# Patient Record
Sex: Male | Born: 1980 | Race: White | Hispanic: No | Marital: Married | Smoking: Current some day smoker
Health system: Southern US, Community
[De-identification: ages and names within clinical notes are randomized; demographics above are authoritative.]

## PROBLEM LIST (undated history)

## (undated) DIAGNOSIS — D693 Immune thrombocytopenic purpura: Secondary | ICD-10-CM

## (undated) HISTORY — PX: TONSILLECTOMY: SUR1361

## (undated) HISTORY — PX: HAND SURGERY: SHX662

## (undated) HISTORY — DX: Immune thrombocytopenic purpura: D69.3

---
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
## 2002-06-27 ENCOUNTER — Ambulatory Visit (HOSPITAL_BASED_OUTPATIENT_CLINIC_OR_DEPARTMENT_OTHER): Admission: RE | Admit: 2002-06-27 | Discharge: 2002-06-27 | Payer: Self-pay | Admitting: Orthopedic Surgery

## 2003-11-27 ENCOUNTER — Emergency Department (HOSPITAL_COMMUNITY): Admission: EM | Admit: 2003-11-27 | Discharge: 2003-11-27 | Payer: Self-pay | Admitting: Emergency Medicine

## 2004-04-06 ENCOUNTER — Emergency Department (HOSPITAL_COMMUNITY): Admission: EM | Admit: 2004-04-06 | Discharge: 2004-04-06 | Payer: Self-pay | Admitting: Emergency Medicine

## 2015-04-19 ENCOUNTER — Emergency Department: Payer: Self-pay

## 2015-04-19 ENCOUNTER — Other Ambulatory Visit: Payer: Self-pay | Admitting: Family Medicine

## 2015-04-19 ENCOUNTER — Emergency Department
Admission: EM | Admit: 2015-04-19 | Discharge: 2015-04-19 | Disposition: A | Discharge status: Home / Self Care | Payer: Self-pay | Attending: Emergency Medicine | Admitting: Emergency Medicine

## 2015-04-19 ENCOUNTER — Encounter: Payer: Self-pay | Admitting: Emergency Medicine

## 2015-04-19 ENCOUNTER — Emergency Department
Admission: EM | Admit: 2015-04-19 | Discharge: 2015-04-20 | Disposition: A | Discharge status: Home / Self Care | Payer: Self-pay | Attending: Emergency Medicine | Admitting: Emergency Medicine

## 2015-04-19 DIAGNOSIS — D693 Immune thrombocytopenic purpura: Secondary | ICD-10-CM | POA: Insufficient documentation

## 2015-04-19 DIAGNOSIS — Z792 Long term (current) use of antibiotics: Secondary | ICD-10-CM | POA: Insufficient documentation

## 2015-04-19 DIAGNOSIS — Z720 Tobacco use: Secondary | ICD-10-CM | POA: Insufficient documentation

## 2015-04-19 DIAGNOSIS — N451 Epididymitis: Secondary | ICD-10-CM | POA: Insufficient documentation

## 2015-04-19 DIAGNOSIS — R1032 Left lower quadrant pain: Secondary | ICD-10-CM

## 2015-04-19 DIAGNOSIS — Z79899 Other long term (current) drug therapy: Secondary | ICD-10-CM | POA: Insufficient documentation

## 2015-04-19 DIAGNOSIS — R6883 Chills (without fever): Secondary | ICD-10-CM | POA: Insufficient documentation

## 2015-04-19 DIAGNOSIS — R590 Localized enlarged lymph nodes: Secondary | ICD-10-CM | POA: Insufficient documentation

## 2015-04-19 LAB — CBC WITH DIFFERENTIAL/PLATELET
BASOS ABS: 0 10*3/uL (ref 0–0.1)
EOS ABS: 0 10*3/uL (ref 0–0.7)
HCT: 42.5 % (ref 40.0–52.0)
Hemoglobin: 14.4 g/dL (ref 13.0–18.0)
Lymphs Abs: 1.8 10*3/uL (ref 1.0–3.6)
MCH: 35.5 pg — ABNORMAL HIGH (ref 26.0–34.0)
MCHC: 34 g/dL (ref 32.0–36.0)
MCV: 104.5 fL — ABNORMAL HIGH (ref 80.0–100.0)
Monocytes Absolute: 0.7 10*3/uL (ref 0.2–1.0)
Monocytes Relative: 10 %
Neutro Abs: 4.2 10*3/uL (ref 1.4–6.5)
Neutrophils Relative %: 63 %
PLATELETS: 41 10*3/uL — AB (ref 150–440)
RBC: 4.07 MIL/uL — AB (ref 4.40–5.90)
RDW: 12.4 % (ref 11.5–14.5)
WBC: 6.8 10*3/uL (ref 3.8–10.6)

## 2015-04-19 LAB — COMPREHENSIVE METABOLIC PANEL
ALBUMIN: 4.5 g/dL (ref 3.5–5.0)
ALT: 22 U/L (ref 17–63)
AST: 34 U/L (ref 15–41)
Alkaline Phosphatase: 68 U/L (ref 38–126)
Anion gap: 8 (ref 5–15)
BUN: 16 mg/dL (ref 6–20)
CHLORIDE: 100 mmol/L — AB (ref 101–111)
CO2: 27 mmol/L (ref 22–32)
CREATININE: 1.27 mg/dL — AB (ref 0.61–1.24)
Calcium: 9.2 mg/dL (ref 8.9–10.3)
GFR calc Af Amer: >60 mL/min (ref >60)
GFR calc non Af Amer: >60 mL/min (ref >60)
Glucose, Bld: 123 mg/dL — ABNORMAL HIGH (ref 65–99)
POTASSIUM: 3.7 mmol/L (ref 3.5–5.1)
SODIUM: 135 mmol/L (ref 135–145)
Total Bilirubin: 1.3 mg/dL — ABNORMAL HIGH (ref 0.3–1.2)
Total Protein: 7.4 g/dL (ref 6.5–8.1)

## 2015-04-19 LAB — URINALYSIS COMPLETE WITH MICROSCOPIC (ARMC ONLY)
BACTERIA UA: NONE SEEN
Bilirubin Urine: NEGATIVE
Glucose, UA: NEGATIVE mg/dL
Hgb urine dipstick: NEGATIVE
Nitrite: NEGATIVE
PH: 6 (ref 5.0–8.0)
PROTEIN: NEGATIVE mg/dL
SPECIFIC GRAVITY, URINE: 1.028 (ref 1.005–1.030)

## 2015-04-19 MED ORDER — LEVOFLOXACIN 500 MG PO TABS: 500.0000 mg | ORAL_TABLET | Freq: Every day | ORAL | Status: DC

## 2015-04-19 MED ORDER — DOXYCYCLINE HYCLATE 100 MG PO TABS
100.0000 mg | ORAL_TABLET | Freq: Once | ORAL | Status: AC
  Administered 2015-04-19: 100 mg via ORAL
  Filled 2015-04-19: qty 1

## 2015-04-19 MED ORDER — DOXYCYCLINE HYCLATE 100 MG PO CAPS: 100.0000 mg | ORAL_CAPSULE | Freq: Two times a day (BID) | ORAL | Status: DC

## 2015-04-19 MED ORDER — IBUPROFEN 800 MG PO TABS
800.0000 mg | ORAL_TABLET | Freq: Once | ORAL | Status: AC
  Administered 2015-04-19: 800 mg via ORAL
  Filled 2015-04-19: qty 1

## 2015-04-19 MED ORDER — OXYCODONE-ACETAMINOPHEN 5-325 MG PO TABS
2.0000 | ORAL_TABLET | Freq: Once | ORAL | Status: AC
  Administered 2015-04-19: 2 via ORAL
  Filled 2015-04-19: qty 2

## 2015-04-19 MED ORDER — AMOXICILLIN-POT CLAVULANATE 875-125 MG PO TABS
1.0000 | ORAL_TABLET | Freq: Once | ORAL | Status: AC
  Administered 2015-04-19: 1 via ORAL
  Filled 2015-04-19: qty 1

## 2015-04-19 MED ORDER — HYDROMORPHONE HCL 1 MG/ML IJ SOLN
1.0000 mg | Freq: Once | INTRAMUSCULAR | Status: AC
  Administered 2015-04-19: 1 mg via INTRAVENOUS
  Filled 2015-04-19: qty 1

## 2015-04-19 MED ORDER — IBUPROFEN 200 MG PO TABS: 600.0000 mg | ORAL_TABLET | Freq: Four times a day (QID) | ORAL | Status: DC | PRN

## 2015-04-19 MED ORDER — IOHEXOL 300 MG/ML  SOLN
100.0000 mL | Freq: Once | INTRAMUSCULAR | Status: AC | PRN
  Administered 2015-04-19: 100 mL via INTRAVENOUS

## 2015-04-19 MED ORDER — AMOXICILLIN-POT CLAVULANATE 875-125 MG PO TABS
1.0000 | ORAL_TABLET | Freq: Two times a day (BID) | ORAL | Status: AC
Start: 2015-04-19 — End: 2015-04-29

## 2015-04-19 MED ORDER — LEVOFLOXACIN 500 MG PO TABS
500.0000 mg | ORAL_TABLET | Freq: Once | ORAL | Status: AC
  Administered 2015-04-19: 500 mg via ORAL
  Filled 2015-04-19: qty 1

## 2015-04-19 MED ORDER — OXYCODONE-ACETAMINOPHEN 5-325 MG PO TABS: 1.0000 | ORAL_TABLET | Freq: Four times a day (QID) | ORAL | Status: DC | PRN

## 2015-04-19 MED ORDER — IOHEXOL 240 MG/ML SOLN
25.0000 mL | Freq: Once | INTRAMUSCULAR | Status: DC | PRN
  Administered 2015-04-19: 25 mL via ORAL

## 2015-04-19 NOTE — ED Notes (Signed)
Reports left groin pain with bulging area x 5 days

## 2015-04-19 NOTE — ED Notes (Signed)
Occasional shaking / chills

## 2015-04-19 NOTE — ED Provider Notes (Signed)
[facility] Emergency Department Provider Note     Time seen: ----------------------------------------- 1:08 PM on 04/19/2015 -----------------------------------------    I have reviewed the triage vital signs and the nursing notes.   HISTORY  Chief Complaint Abdominal Pain    HPI TKAI SERFASS is a 34 y.o. male who presents to ER for left groin pain with a mass in the last 5 days. He said some chills and shaking, complains of severe pain in the left lower quadrant. Has not had this happen before. There is swelling near his left groin. He has not had a history of this before. Walking and activity makes his symptoms worse.   History reviewed. No pertinent past medical history.  There are no active problems to display for this patient.   Past Surgical History  Procedure Laterality Date  . Hand surgery      Allergies Review of patient's allergies indicates no known allergies.  Social History Social History  Substance Use Topics  . Smoking status: Current Some Day Smoker  . Smokeless tobacco: None  . Alcohol Use: Yes    Review of Systems Constitutional: Negative for fever. Positive for chills Eyes: Negative for visual changes. ENT: Negative for sore throat. Cardiovascular: Negative for chest pain. Respiratory: Negative for shortness of breath. Gastrointestinal: Negative for abdominal pain, vomiting and diarrhea. Genitourinary: Negative for dysuria. Positive for left groin pain and swelling Musculoskeletal: Negative for back pain. Skin: Negative for rash. Neurological: Negative for headaches, focal weakness or numbness.  10-point ROS otherwise negative.  ____________________________________________   PHYSICAL EXAM:  VITAL SIGNS: ED Triage Vitals  Enc Vitals Group     BP 04/19/15 1243 133/79 mmHg     Pulse Rate 04/19/15 1243 107     Resp 04/19/15 1243 20     Temp 04/19/15 1243 98.3 F (36.8 C)     Temp Source 04/19/15 1243 Oral      SpO2 04/19/15 1243 10 %     Weight 04/19/15 1243 200 lb (90.719 kg)     Height 04/19/15 1243 6' (1.829 m)     Head Cir --      Peak Flow --      Pain Score 04/19/15 1243 8     Pain Loc --      Pain Edu? --      Excl. in GC? --     Constitutional: Alert and oriented. Well appearing and in no distress. Eyes: Conjunctivae are normal. PERRL. Normal extraocular movements. ENT   Head: Normocephalic and atraumatic.   Nose: No congestion/rhinnorhea.   Mouth/Throat: Mucous membranes are moist.   Neck: No stridor. Cardiovascular: Normal rate, regular rhythm. Normal and symmetric distal pulses are present in all extremities. No murmurs, rubs, or gallops. Respiratory: Normal respiratory effort without tachypnea nor retractions. Breath sounds are clear and equal bilaterally. No wheezes/rales/rhonchi. Gastrointestinal: There is a fixed hard nodule in the left groin. No hernia or masses appreciated. Musculoskeletal: Nontender with normal range of motion in all extremities. No joint effusions.  No lower extremity tenderness nor edema. Neurologic:  Normal speech and language. No gross focal neurologic deficits are appreciated. Speech is normal. No gait instability. Skin:  Skin is warm, dry and intact. No rash noted. Psychiatric: Mood and affect are normal. Speech and behavior are normal. Patient exhibits appropriate insight and judgment.  ____________________________________________  ED COURSE:  Pertinent labs & imaging results that were available during my care of the patient were reviewed by me and considered in my medical  decision making (see chart for details). Patient will need imaging, basic labs. Unclear etiology however this is a typical location for inguinal adenopathy ____________________________________________    LABS (pertinent positives/negatives)  Labs Reviewed  CBC WITH DIFFERENTIAL/PLATELET - Abnormal; Notable for the following:    RBC 4.07 (*)    MCV 104.5 (*)     MCH 35.5 (*)    Platelets 41 (*)    All other components within normal limits  COMPREHENSIVE METABOLIC PANEL - Abnormal; Notable for the following:    Chloride 100 (*)    Glucose, Bld 123 (*)    Creatinine, Ser 1.27 (*)    Total Bilirubin 1.3 (*)    All other components within normal limits  URINALYSIS COMPLETEWITH MICROSCOPIC ([facility] ONLY)    RADIOLOGY Images were viewed by me  CT abdomen and pelvis IMPRESSION: Several enlarged lymph nodes within the superficial subcutaneous soft tissues of the left groin/inguinal region, largest measuring 2.3 x 1.7 cm, with surrounding ill-defined soft tissue edema. This most likely indicates an infectious or inflammatory lymphadenitis. Alternatively, this could represent localized cellulitis with reactive lymphadenopathy. Recommend follow-up with physical examination to ensure resolution. If persists or worsens, neoplastic lymphadenopathy would not be excluded and biopsy would be recommended to ensure benignity.  Remainder of the abdomen and pelvis CT is unremarkable. 2 mm nonobstructing left renal stone without hydronephrosis. ____________________________________________  FINAL ASSESSMENT AND PLAN  Groin swelling, adenopathy  Plan: Patient with labs and imaging as dictated above. Patient with inguinal adenopathy, unclear etiology. Discussed with oncology will see him in the office next Tuesday. He also has chronic thrombocytopenia which he knows about. He is not currently having any bleeding. Stable for outpatient follow-up   Emily Filbert, MD   Emily Filbert, MD 04/19/15 [phone]

## 2015-04-19 NOTE — Discharge Instructions (Signed)
Epididymitis °Epididymitis is a swelling (inflammation) of the epididymis. The epididymis is a cord-like structure along the back part of the testicle. Epididymitis is usually, but not always, caused by infection. This is usually a sudden problem beginning with chills, fever and pain behind the scrotum and in the testicle. There may be swelling and redness of the testicle. °DIAGNOSIS  °Physical examination will reveal a tender, swollen epididymis. Sometimes, cultures are obtained from the urine or from prostate secretions to help find out if there is an infection or if the cause is a different problem. Sometimes, blood work is performed to see if your white blood cell count is elevated and if a germ (bacterial) or viral infection is present. Using this knowledge, an appropriate medicine which kills germs (antibiotic) can be chosen by your caregiver. A viral infection causing epididymitis will most often go away (resolve) without treatment. °HOME CARE INSTRUCTIONS  °· Hot sitz baths for 20 minutes, 4 times per day, may help relieve pain. °· Only take over-the-counter or prescription medicines for pain, discomfort or fever as directed by your caregiver. °· Take all medicines, including antibiotics, as directed. Take the antibiotics for the full prescribed length of time even if you are feeling better. °· It is very important to keep all follow-up appointments. °SEEK IMMEDIATE MEDICAL CARE IF:  °· You have a fever. °· You have pain not relieved with medicines. °· You have any worsening of your problems. °· Your pain seems to come and go. °· You develop pain, redness, and swelling in the scrotum and surrounding areas. °MAKE SURE YOU:  °· Understand these instructions. °· Will watch your condition. °· Will get help right away if you are not doing well or get worse. °Document Released: 08/07/2000 Document Revised: 11/02/2011 Document Reviewed: 06/27/2009 °[employer]® Patient Information ©2015 [employer]. This information  is not intended to replace advice given to you by your health care provider. Make sure you discuss any questions you have with your health care provider. ° °

## 2015-04-19 NOTE — ED Provider Notes (Signed)
[facility] Emergency Department Provider Note  ____________________________________________  Time seen: 10:20 PM  I have reviewed the triage vital signs and the nursing notes.   HISTORY  Chief Complaint Fever    HPI Christian Roberson is a 34 y.o. male who complains of left inguinal swelling for the past 5 days. He was seen in the ER this morning and discharged with pain medication and referred to oncology after blood tests and CT scan revealed inguinal lymphadenopathy.He reports that in addition to the ongoing pain, he is also having fever this evening.  Denies dysuria frequency urgency or hematuria. Denies any genital lesions or penile discharge. Denies any high risk sexual behavior. Does report that he has some left testicular pain that is worse with walking.     History reviewed. No pertinent past medical history. Chronic thrombocytopenia There are no active problems to display for this patient.   Past Surgical History  Procedure Laterality Date  . Hand surgery      Current Outpatient Rx  Name  Route  Sig  Dispense  Refill  . amoxicillin-clavulanate (AUGMENTIN) 875-125 MG per tablet   Oral   Take 1 tablet by mouth every 12 (twelve) hours.   20 tablet   0   . doxycycline (VIBRAMYCIN) 100 MG capsule   Oral   Take 1 capsule (100 mg total) by mouth 2 (two) times daily.   20 capsule   0   . ibuprofen (MOTRIN IB) 200 MG tablet   Oral   Take 3 tablets (600 mg total) by mouth every 6 (six) hours as needed.   60 tablet   0   . levofloxacin (LEVAQUIN) 500 MG tablet   Oral   Take 1 tablet (500 mg total) by mouth daily.   10 tablet   0   . oxyCODONE-acetaminophen (ROXICET) 5-325 MG per tablet   Oral   Take 1 tablet by mouth every 6 (six) hours as needed.   20 tablet   0   . oxyCODONE-acetaminophen (ROXICET) 5-325 MG per tablet   Oral   Take 1 tablet by mouth every 6 (six) hours as needed for severe pain.   12 tablet   0      Allergies Review of patient's allergies indicates no known allergies.  History reviewed. No pertinent family history.  Social History Social History  Substance Use Topics  . Smoking status: Current Some Day Smoker  . Smokeless tobacco: None  . Alcohol Use: Yes    Review of Systems  Constitutional: Positive fever and chills. No weight changes Eyes:No blurry vision or double vision.  ENT: No sore throat. Cardiovascular: No chest pain. Respiratory: No dyspnea or cough. Gastrointestinal: Left groin swelling and pain with left testicular pain  No BRBPR or melena. Genitourinary: Negative for dysuria, urinary retention, bloody urine, or difficulty urinating. Left testicular pain as above Musculoskeletal: Negative for back pain. No joint swelling or pain. Skin: Negative for rash. Neurological: Negative for headaches, focal weakness or numbness. Psychiatric:No anxiety or depression.   Endocrine:No hot/cold intolerance, changes in energy, or sleep difficulty.  10-point ROS otherwise negative.  ____________________________________________   PHYSICAL EXAM:  VITAL SIGNS: ED Triage Vitals  Enc Vitals Group     BP 04/19/15 2205 161/90 mmHg     Pulse Rate 04/19/15 2205 107     Resp 04/19/15 2205 22     Temp 04/19/15 2205 102.2 F (39 C)     Temp Source 04/19/15 2205 Oral  SpO2 04/19/15 2205 100 %     Weight 04/19/15 2205 200 lb (90.719 kg)     Height 04/19/15 2205 6' (1.829 m)     Head Cir --      Peak Flow --      Pain Score 04/19/15 2205 9     Pain Loc --      Pain Edu? --      Excl. in GC? --      Constitutional: Alert and oriented. Moderate distress due to pain. Eyes: No scleral icterus. No conjunctival pallor. PERRL. EOMI ENT   Head: Normocephalic and atraumatic.   Nose: No congestion/rhinnorhea. No septal hematoma   Mouth/Throat: MMM, no pharyngeal erythema. No peritonsillar mass. No uvula shift.   Neck: No stridor. No SubQ emphysema. No  meningismus. Hematological/Lymphatic/Immunilogical: No cervical lymphadenopathy. Cardiovascular: RRR. Normal and symmetric distal pulses are present in all extremities. No murmurs, rubs, or gallops. Respiratory: Normal respiratory effort without tachypnea nor retractions. Breath sounds are clear and equal bilaterally. No wheezes/rales/rhonchi. Gastrointestinal: Soft and nontender. No distention. There is no CVA tenderness.  No rebound, rigidity, or guarding. Genitourinary: There is a markedly swollen and tender left inguinal lymph node. Right side is unremarkable. No genital lesions. No peritoneal inflammation. Scrotum is unremarkable, but left epididymis is markedly tender. Right scrotum is unremarkable. No left testicular tenderness. Palpation of the external ring of the left inguinal canal with Valsalva maneuver does elicit significant pain. Musculoskeletal: Nontender with normal range of motion in all extremities. No joint effusions.  No lower extremity tenderness.  No edema. Neurologic:   Normal speech and language.  CN 2-10 normal. Motor grossly intact. No pronator drift.  Normal gait. No gross focal neurologic deficits are appreciated.  Skin:  Skin is warm, dry and intact. No rash noted.  No petechiae, purpura, or bullae. Psychiatric: Mood and affect are normal. Speech and behavior are normal. Patient exhibits appropriate insight and judgment.  ____________________________________________    LABS (pertinent positives/negatives) (all labs ordered are listed, but only abnormal results are displayed) Labs Reviewed  URINALYSIS COMPLETEWITH MICROSCOPIC ([facility] ONLY)   ____________________________________________   EKG    ____________________________________________    RADIOLOGY  Ultrasound scrotum pending.  ____________________________________________   PROCEDURES  ____________________________________________   INITIAL IMPRESSION / ASSESSMENT AND PLAN / ED  COURSE  Pertinent labs & imaging results that were available during my care of the patient were reviewed by me and considered in my medical decision making (see chart for details).  Patient presents with left inguinal lymphadenopathy as well as epididymis tenderness consistent with epididymitis. Based on history there is low suspicion for sexually transmitted infection. However, as the patient is a 34 year old male without any other reason, we will treat him presumptively for the most common agents with IM ceftriaxone and oral doxycycline and Levaquin. He does have follow-up established with oncology after his evaluation by Dr. Mayford Knife earlier today, and we will encourage him to continue that, but if the antibiotics do not improve his symptoms over the weekend, he should also follow up with urology. I have ordered an ultrasound of the scrotum, and we'll plan for the patient to be discharged home with pain medication, NSAIDs, doxycycline and Levaquin. Care of the patient will be signed out to the oncoming physician Dr. Juliette Alcide at 11:00 PM.  ____________________________________________   FINAL CLINICAL IMPRESSION(S) / ED DIAGNOSES  Final diagnoses:  Epididymitis, left      Sharman Cheek, MD 04/19/15 2242

## 2015-04-19 NOTE — Discharge Instructions (Signed)
Lymphadenopathy °Lymphadenopathy means "disease of the lymph glands." But the term is usually used to describe swollen or enlarged lymph glands, also called lymph nodes. These are the bean-shaped organs found in many locations including the neck, underarm, and groin. Lymph glands are part of the immune system, which fights infections in your body. Lymphadenopathy can occur in just one area of the body, such as the neck, or it can be generalized, with lymph node enlargement in several areas. The nodes found in the neck are the most common sites of lymphadenopathy. °CAUSES °When your immune system responds to germs (such as viruses or bacteria ), infection-fighting cells and fluid build up. This causes the glands to grow in size. Usually, this is not something to worry about. Sometimes, the glands themselves can become infected and inflamed. This is called lymphadenitis. °Enlarged lymph nodes can be caused by many diseases: °· Bacterial disease, such as strep throat or a skin infection. °· Viral disease, such as a common cold. °· Other germs, such as Lyme disease, tuberculosis, or sexually transmitted diseases. °· Cancers, such as lymphoma (cancer of the lymphatic system) or leukemia (cancer of the white blood cells). °· Inflammatory diseases such as lupus or rheumatoid arthritis. °· Reactions to medications. °Many of the diseases above are rare, but important. This is why you should see your caregiver if you have lymphadenopathy. °SYMPTOMS °· Swollen, enlarged lumps in the neck, back of the head, or other locations. °· Tenderness. °· Warmth or redness of the skin over the lymph nodes. °· Fever. °DIAGNOSIS °Enlarged lymph nodes are often near the source of infection. They can help health care providers diagnose your illness. For instance: °· Swollen lymph nodes around the jaw might be caused by an infection in the mouth. °· Enlarged glands in the neck often signal a throat infection. °· Lymph nodes that are swollen in  more than one area often indicate an illness caused by a virus. °Your caregiver will likely know what is causing your lymphadenopathy after listening to your history and examining you. Blood tests, x-rays, or other tests may be needed. If the cause of the enlarged lymph node cannot be found, and it does not go away by itself, then a biopsy may be needed. Your caregiver will discuss this with you. °TREATMENT °Treatment for your enlarged lymph nodes will depend on the cause. Many times the nodes will shrink to normal size by themselves, with no treatment. Antibiotics or other medicines may be needed for infection. Only take over-the-counter or prescription medicines for pain, discomfort, or fever as directed by your caregiver. °HOME CARE INSTRUCTIONS °Swollen lymph glands usually return to normal when the underlying medical condition goes away. If they persist, contact your health-care provider. He/she might prescribe antibiotics or other treatments, depending on the diagnosis. Take any medications exactly as prescribed. Keep any follow-up appointments made to check on the condition of your enlarged nodes. °SEEK MEDICAL CARE IF: °· Swelling lasts for more than two weeks. °· You have symptoms such as weight loss, night sweats, fatigue, or fever that does not go away. °· The lymph nodes are hard, seem fixed to the skin, or are growing rapidly. °· Skin over the lymph nodes is red and inflamed. This could mean there is an infection. °SEEK IMMEDIATE MEDICAL CARE IF: °· Fluid starts leaking from the area of the enlarged lymph node. °· You develop a fever of 102° F (38.9° C) or greater. °· Severe pain develops (not necessarily at the site of a   large lymph node).  You develop chest pain or shortness of breath.  You develop worsening abdominal pain. MAKE SURE YOU:  Understand these instructions.  Will watch your condition.  Will get help right away if you are not doing well or get worse. Document Released:  05/19/2008 Document Revised: 12/25/2013 Document Reviewed: 05/19/2008 [facility] Patient Information 2015 [place], [place]. This information is not intended to replace advice given to you by your health care provider. Make sure you discuss any questions you have with your health care provider.  Thrombocytopenia Thrombocytopenia is a condition in which there is an abnormally small number of platelets in your blood. Platelets are also called thrombocytes. Platelets are needed for blood clotting. CAUSES Thrombocytopenia is caused by:   Decreased production of platelets. This can be caused by:  Aplastic anemia in which your bone marrow quits making blood cells.  Cancer in the bone marrow.  Use of certain medicines, including chemotherapy.  Infection in the bone marrow.  Heavy alcohol consumption.  Increased destruction of platelets. This can be caused by:  Certain immune diseases.  Use of certain drugs.  Certain blood clotting disorders.  Certain inherited disorders.  Certain bleeding disorders.  Pregnancy.  Having an enlarged spleen (hypersplenism). In hypersplenism, the spleen gathers up platelets from circulation. This means the platelets are not available to help with blood clotting. The spleen can enlarge due to cirrhosis or other conditions. SYMPTOMS  The symptoms of thrombocytopenia are side effects of poor blood clotting. Some of these are:  Abnormal bleeding.  Nosebleeds.  Heavy menstrual periods.  Blood in the urine or stools.  Purpura. This is a purplish discoloration in the skin produced by small bleeding vessels near the surface of the skin.  Bruising.  A rash that may be petechial. This looks like pinpoint, purplish-red spots on the skin and mucous membranes. It is caused by bleeding from small blood vessels (capillaries). DIAGNOSIS  Your caregiver will make this diagnosis based on your exam and blood tests. Sometimes, a bone marrow study is done to look for  the original cells (megakaryocytes) that make platelets. TREATMENT  Treatment depends on the cause of the condition.  Medicines may be given to help protect your platelets from being destroyed.  In some cases, a replacement (transfusion) of platelets may be required to stop or prevent bleeding.  Sometimes, the spleen must be surgically removed. HOME CARE INSTRUCTIONS   Check the skin and linings inside your mouth for bruising or bleeding as directed by your caregiver.  Check your sputum, urine, and stool for blood as directed by your caregiver.  Do not return to any activities that could cause bumps or bruises until your caregiver says it is okay.  Take extra care not to cut yourself when shaving or when using scissors, needles, knives, and other tools.  Take extra care not to burn yourself when ironing or cooking.  Ask your caregiver if it is okay for you to drink alcohol.  Only take over-the-counter or prescription medicines as directed by your caregiver.  Notify all your caregivers, including dentists and eye doctors, about your condition. SEEK IMMEDIATE MEDICAL CARE IF:   You develop active bleeding from anywhere in your body.  You develop unexplained bruising or bleeding.  You have blood in your sputum, urine, or stool. MAKE SURE YOU:  Understand these instructions.  Will watch your condition.  Will get help right away if you are not doing well or get worse. Document Released: 08/10/2005 Document Revised: 11/02/2011 Document  Reviewed: 06/12/2011 [employer] Patient Information 2015 [place], [place]. This information is not intended to replace advice given to you by your health care provider. Make sure you discuss any questions you have with your health care provider.

## 2015-04-19 NOTE — ED Notes (Signed)
MD at bedside., Dr.Williams  

## 2015-04-19 NOTE — ED Notes (Signed)
Pt states was seen earlier today and was told to follow up with oncology due to swollen lymph nodes. Pt states spiked temp greater than 102 at home with increased pain to left lower quadrant at 2100.

## 2015-04-20 NOTE — ED Provider Notes (Signed)
Urinalysis is clear UltraSOUND of the scrotum and testes is normal I concur with the plan to treat him with doxycycline and Levaquin and follow-up as noted with his doctor in the cancer center for the time being  Arnaldo Natal, MD 04/20/15 [phone]

## 2015-04-23 ENCOUNTER — Encounter: Payer: Self-pay | Admitting: Oncology

## 2015-04-23 ENCOUNTER — Inpatient Hospital Stay: Payer: BLUE CROSS/BLUE SHIELD | Admitting: Oncology

## 2015-04-23 ENCOUNTER — Inpatient Hospital Stay: Payer: Self-pay | Attending: Oncology

## 2015-04-23 ENCOUNTER — Inpatient Hospital Stay: Payer: BLUE CROSS/BLUE SHIELD

## 2015-04-23 ENCOUNTER — Inpatient Hospital Stay (HOSPITAL_BASED_OUTPATIENT_CLINIC_OR_DEPARTMENT_OTHER): Payer: Self-pay | Admitting: Oncology

## 2015-04-23 VITALS — BP 104/74 | HR 64 | Temp 95.9°F | Resp 18 | Ht 73.23 in | Wt 200.8 lb

## 2015-04-23 DIAGNOSIS — R590 Localized enlarged lymph nodes: Secondary | ICD-10-CM | POA: Insufficient documentation

## 2015-04-23 DIAGNOSIS — R1032 Left lower quadrant pain: Secondary | ICD-10-CM | POA: Insufficient documentation

## 2015-04-23 DIAGNOSIS — D696 Thrombocytopenia, unspecified: Secondary | ICD-10-CM | POA: Insufficient documentation

## 2015-04-23 DIAGNOSIS — F17210 Nicotine dependence, cigarettes, uncomplicated: Secondary | ICD-10-CM | POA: Insufficient documentation

## 2015-04-23 DIAGNOSIS — Z806 Family history of leukemia: Secondary | ICD-10-CM

## 2015-04-23 DIAGNOSIS — R591 Generalized enlarged lymph nodes: Secondary | ICD-10-CM

## 2015-04-23 DIAGNOSIS — Z79899 Other long term (current) drug therapy: Secondary | ICD-10-CM

## 2015-04-23 LAB — VITAMIN B12: Vitamin B-12: 496 pg/mL (ref 180–914)

## 2015-04-23 LAB — CBC
HCT: 41.4 % (ref 40.0–52.0)
HEMOGLOBIN: 14.3 g/dL (ref 13.0–18.0)
MCH: 35.7 pg — ABNORMAL HIGH (ref 26.0–34.0)
MCHC: 34.5 g/dL (ref 32.0–36.0)
MCV: 103.7 fL — ABNORMAL HIGH (ref 80.0–100.0)
Platelets: 51 10*3/uL — ABNORMAL LOW (ref 150–440)
RBC: 3.99 MIL/uL — ABNORMAL LOW (ref 4.40–5.90)
RDW: 12.5 % (ref 11.5–14.5)
WBC: 4.7 10*3/uL (ref 3.8–10.6)

## 2015-04-23 LAB — IRON AND TIBC
IRON: 84 ug/dL (ref 45–182)
Saturation Ratios: 26 % (ref 17.9–39.5)
TIBC: 318 ug/dL (ref 250–450)
UIBC: 234 ug/dL

## 2015-04-23 LAB — FOLATE: Folate: 6.3 ng/mL (ref >5.9)

## 2015-04-23 LAB — LACTATE DEHYDROGENASE: LDH: 193 U/L — ABNORMAL HIGH (ref 98–192)

## 2015-04-23 LAB — FERRITIN: FERRITIN: 336 ng/mL (ref 24–336)

## 2015-04-23 NOTE — Progress Notes (Signed)
Patient was evaluated at the ER for groin pain and swelling.  During evaluation he had a low platelet count and the patient reports he has had thrombocytopenia for 18 years that did get a work up at [facility].  The ER prescribed him an antibiotic along with pain medication since taking the antibiotic the swelling has subsided but still has pain.  He has to take the pain medication on a regular basis to keep the pain at a 1-2/10 on pain scale but if he does not take the med his pain will increase to a 8/10.

## 2015-04-24 LAB — HAPTOGLOBIN: Haptoglobin: 92 mg/dL (ref 34–200)

## 2015-04-24 LAB — ANA W/REFLEX: Anti Nuclear Antibody(ANA): NEGATIVE

## 2015-04-24 LAB — PLATELET ANTIBODY PROFILE, SERUM
HLA AB SER QL EIA: NEGATIVE
IA/IIA Antibody: POSITIVE — AB
IB/IX Antibody: NEGATIVE
IIB/IIIA ANTIBODY: POSITIVE — AB

## 2015-04-28 NOTE — Progress Notes (Signed)
[facility]  [phone] [phone]  ID: Ferdinand Lango OB: Jan 06, 1981  MR#: [id number]  AOZ#:[id number]  Patient Care Team: Kerman Passey, MD as PCP - General (Family Medicine)  CHIEF COMPLAINT:  Chief Complaint  Patient presents with  . New Evaluation    ER visit f/u for Inguinal adenopathy and Chronic idiopathic thrombocytopenia    INTERVAL HISTORY: Patient is a 34 year old male with past medical history of thrombocytopenia who was evaluated in the ER with left groin swelling and pain. Subsequent CT scan revealed lymphadenopathy. He otherwise feels well and is asymptomatic. He denies any fevers, night sweats, or weight loss. He denies any easy bleeding or bruising. He has no urinary complaints. He denies any chest pain or shortness of breath. He has a good appetite and denies any nausea, vomiting, constipation, or diarrhea. Patient otherwise feels well and offers no further specific complaints.  REVIEW OF SYSTEMS:   Review of Systems  Constitutional: Negative for fever, weight loss and malaise/fatigue.  Respiratory: Negative.   Cardiovascular: Negative.   Gastrointestinal: Negative.   Genitourinary: Negative.   Musculoskeletal: Negative.   Neurological: Negative for weakness.  Endo/Heme/Allergies: Does not bruise/bleed easily.    As per HPI. Otherwise, a complete review of systems is negatve.  PAST MEDICAL HISTORY: Past Medical History  Diagnosis Date  . Chronic idiopathic thrombocytopenia     PAST SURGICAL HISTORY: Past Surgical History  Procedure Laterality Date  . Hand surgery      FAMILY HISTORY Family History  Problem Relation Age of Onset  . Leukemia Cousin   . Leukemia Cousin   . Leukemia Cousin   . Leukemia Maternal Uncle        ADVANCED DIRECTIVES:    HEALTH MAINTENANCE: Social History  Substance Use Topics  . Smoking status: Current Some Day Smoker  . Smokeless tobacco: Not on file  . Alcohol Use: Yes      Colonoscopy:  PAP:  Bone density:  Lipid panel:  No Known Allergies  Current Outpatient Prescriptions  Medication Sig Dispense Refill  . amoxicillin-clavulanate (AUGMENTIN) 875-125 MG per tablet Take 1 tablet by mouth every 12 (twelve) hours. 20 tablet 0  . ibuprofen (MOTRIN IB) 200 MG tablet Take 3 tablets (600 mg total) by mouth every 6 (six) hours as needed. 60 tablet 0  . oxyCODONE-acetaminophen (ROXICET) 5-325 MG per tablet Take 1 tablet by mouth every 6 (six) hours as needed for severe pain. 12 tablet 0   No current facility-administered medications for this visit.    OBJECTIVE: Filed Vitals:   04/23/15 1114  BP: 104/74  Pulse: 64  Temp: 95.9 F (35.5 C)  Resp: 18     Body mass index is 26.33 kg/(m^2).    ECOG FS:0 - Asymptomatic  General: Well-developed, well-nourished, no acute distress. Eyes: Pink conjunctiva, anicteric sclera. HEENT: Normocephalic, moist mucous membranes, clear oropharnyx. Lungs: Clear to auscultation bilaterally. Heart: Regular rate and rhythm. No rubs, murmurs, or gallops. Abdomen: Soft, nontender, nondistended. No organomegaly noted, normoactive bowel sounds. Musculoskeletal: No edema, cyanosis, or clubbing. Neuro: Alert, answering all questions appropriately. Cranial nerves grossly intact. Skin: No rashes or petechiae noted. Psych: Normal affect. Lymphatics: Left inguinal lymphadenopathy.  LAB RESULTS:  Lab Results  Component Value Date   NA 135 04/19/2015   K 3.7 04/19/2015   CL 100* 04/19/2015   CO2 27 04/19/2015   GLUCOSE 123* 04/19/2015   BUN 16 04/19/2015   CREATININE 1.27* 04/19/2015   CALCIUM 9.2 04/19/2015   PROT  7.4 04/19/2015   ALBUMIN 4.5 04/19/2015   AST 34 04/19/2015   ALT 22 04/19/2015   ALKPHOS 68 04/19/2015   BILITOT 1.3* 04/19/2015   GFRNONAA >60 04/19/2015   GFRAA >60 04/19/2015    Lab Results  Component Value Date   WBC 4.7 04/23/2015   NEUTROABS 4.2 04/19/2015   HGB 14.3 04/23/2015   HCT 41.4  04/23/2015   MCV 103.7* 04/23/2015   PLT 51* 04/23/2015     STUDIES: US Scrotum  04/20/2015   CLINICAL DATA:  Left groin pain with bulging area for 5 days. Left epididymal tenderness and left inguinal lymphadenopathy.  EXAM: SCROTAL ULTRASOUND  DOPPLER ULTRASOUND OF THE TESTICLES  TECHNIQUE: Complete ultrasound examination of the testicles, epididymis, and other scrotal structures was performed. Color and spectral Doppler ultrasound were also utilized to evaluate blood flow to the testicles.  COMPARISON:  CT abdomen and pelvis 04/19/2015  FINDINGS: Right testicle  Measurements: 5.3 x 2.6 x 3.2 cm. No mass or microlithiasis visualized.  Left testicle  Measurements: 5.4 x 2.8 x 3.2 cm. No mass or microlithiasis visualized.  Right epididymis:  Normal in size and appearance.  Left epididymis:  Normal in size and appearance.  Hydrocele:  Small bilateral hydroceles.  Varicocele:  None visualized.  Pulsed Doppler interrogation of both testes demonstrates normal low resistance arterial and venous waveforms bilaterally. Normal homogeneous flow is demonstrated in both testes and epididymides on color flow Doppler imaging.  Images obtained of the left groin corresponding to the area of pain in bulging demonstrate prominent hypervascular lymph nodes measuring up to 3.2 x 1.6 x 1.7 cm. Lymph nodes demonstrate fatty hila with central flow consistent with inflammatory nodes. These changes are also demonstrated on the previous CT scan.  IMPRESSION: Normal sound appearance of the testicles. No evidence of testicular mass or torsion. Small bilateral hydroceles. Incidental note of prominent lymph nodes in the left groin region, likely inflammatory.   Electronically Signed   By: Burman Nieves M.D.   On: 04/20/2015 00:30   Ct Abdomen Pelvis W Contrast  04/19/2015   CLINICAL DATA:  Left groin pain with bulging area for 5 days.  EXAM: CT ABDOMEN AND PELVIS WITH CONTRAST  TECHNIQUE: Multidetector CT imaging of the abdomen and  pelvis was performed using the standard protocol following bolus administration of intravenous contrast.  CONTRAST:  OMNIPAQUE IOHEXOL 300 MG/ML  SOLN  COMPARISON:  None.  FINDINGS: There are several enlarged lymph nodes within the left groin/inguinal region, largest measuring 2.3 x 1.7 cm (series 2, image 62), with surrounding ill-defined soft tissue edema. Additional mildly prominent lymphadenopathy noted along the left iliac chain region. No circumscribed fluid collection or abscess like collection in this area.  The bowel is normal in caliber and configuration. No bowel wall thickening or evidence of bowel wall inflammation. No bowel hernia. No free fluid or abscess collection within the abdomen or pelvis. No free intraperitoneal air.  Liver, spleen, pancreas, gallbladder, and adrenal glands are normal. 2 mm nonobstructing stone noted within the left renal pelvis. Right kidney appears normal without stone or hydronephrosis. No ureteral or bladder calculi identified. Abdominal aorta is normal in caliber. Lung bases are clear. No significant osseous abnormality.  IMPRESSION: Several enlarged lymph nodes within the superficial subcutaneous soft tissues of the left groin/inguinal region, largest measuring 2.3 x 1.7 cm, with surrounding ill-defined soft tissue edema. This most likely indicates an infectious or inflammatory lymphadenitis. Alternatively, this could represent localized cellulitis with reactive lymphadenopathy. Recommend follow-up with  physical examination to ensure resolution. If persists or worsens, neoplastic lymphadenopathy would not be excluded and biopsy would be recommended to ensure benignity.  Remainder of the abdomen and pelvis CT is unremarkable. 2 mm nonobstructing left renal stone without hydronephrosis.   Electronically Signed   By: Bary Richard M.D.   On: 04/19/2015 14:40   [place] Art/ven Flow Abd Pelv Doppler  04/20/2015   CLINICAL DATA:  Left groin pain with bulging area for 5  days. Left epididymal tenderness and left inguinal lymphadenopathy.  EXAM: SCROTAL ULTRASOUND  DOPPLER ULTRASOUND OF THE TESTICLES  TECHNIQUE: Complete ultrasound examination of the testicles, epididymis, and other scrotal structures was performed. Color and spectral Doppler ultrasound were also utilized to evaluate blood flow to the testicles.  COMPARISON:  CT abdomen and pelvis 04/19/2015  FINDINGS: Right testicle  Measurements: 5.3 x 2.6 x 3.2 cm. No mass or microlithiasis visualized.  Left testicle  Measurements: 5.4 x 2.8 x 3.2 cm. No mass or microlithiasis visualized.  Right epididymis:  Normal in size and appearance.  Left epididymis:  Normal in size and appearance.  Hydrocele:  Small bilateral hydroceles.  Varicocele:  None visualized.  Pulsed Doppler interrogation of both testes demonstrates normal low resistance arterial and venous waveforms bilaterally. Normal homogeneous flow is demonstrated in both testes and epididymides on color flow Doppler imaging.  Images obtained of the left groin corresponding to the area of pain in bulging demonstrate prominent hypervascular lymph nodes measuring up to 3.2 x 1.6 x 1.7 cm. Lymph nodes demonstrate fatty hila with central flow consistent with inflammatory nodes. These changes are also demonstrated on the previous CT scan.  IMPRESSION: Normal sound appearance of the testicles. No evidence of testicular mass or torsion. Small bilateral hydroceles. Incidental note of prominent lymph nodes in the left groin region, likely inflammatory.   Electronically Signed   By: Burman Nieves M.D.   On: 04/20/2015 00:30    ASSESSMENT: Lymphadenopathy, thrombocytopenia.  PLAN:    1. Lymphadenopathy: Despite no other obvious signs of infection, patient was given anti-biotics emergency room prophylactically. CT scan results reviewed independently and reported as above. Peripheral blood flow cytometry is negative.  Will get PET scan for further evaluation. If PET scan is  positive, we will proceed with lymph node biopsy. If negative, no further intervention is needed. Patient will return to clinic in the next 1-2 weeks to discuss his results. 2. 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 Patient was found to have platelet antibodies, but otherwise the remainder of his laboratory work is either negative or within normal limits. Can consider a course of steroid-induced to improve his platelet count prior to biopsy.  Patient expressed understanding and was in agreement with this plan. He also understands that He can call clinic at any time with any questions, concerns, or complaints.    Jeralyn Ruths, MD   04/28/2015 8:33 AM

## 2015-04-30 ENCOUNTER — Ambulatory Visit
Admission: RE | Admit: 2015-04-30 | Discharge: 2015-04-30 | Disposition: A | Discharge status: Home / Self Care | Payer: BLUE CROSS/BLUE SHIELD | Source: Ambulatory Visit | Attending: Oncology | Admitting: Oncology

## 2015-04-30 DIAGNOSIS — R591 Generalized enlarged lymph nodes: Secondary | ICD-10-CM

## 2015-04-30 DIAGNOSIS — N200 Calculus of kidney: Secondary | ICD-10-CM | POA: Insufficient documentation

## 2015-04-30 DIAGNOSIS — R590 Localized enlarged lymph nodes: Secondary | ICD-10-CM | POA: Insufficient documentation

## 2015-04-30 LAB — GLUCOSE, CAPILLARY: Glucose-Capillary: 94 mg/dL (ref 65–99)

## 2015-04-30 MED ORDER — FLUDEOXYGLUCOSE F - 18 (FDG) INJECTION
12.0100 | Freq: Once | INTRAVENOUS | Status: DC | PRN
  Administered 2015-04-30: 12.01 via INTRAVENOUS
  Filled 2015-04-30: qty 12.01

## 2015-05-02 ENCOUNTER — Other Ambulatory Visit: Payer: Self-pay | Admitting: *Deleted

## 2015-05-02 MED ORDER — AMOXICILLIN-POT CLAVULANATE 875-125 MG PO TABS: 1.0000 | ORAL_TABLET | Freq: Two times a day (BID) | ORAL | Status: DC

## 2015-06-27 LAB — COMP PANEL: LEUKEMIA/LYMPHOMA

## 2015-09-26 IMAGING — US US ART/VEN ABD/PELV/SCROTUM DOPPLER LTD
1 series · 13 of 25 positions shown · non-contrast
Comparison: CT abdomen and pelvis 04/19/2015

CLINICAL DATA: Left groin pain with bulging area for 5 days. Left
epididymal tenderness and left inguinal lymphadenopathy.

EXAM:
SCROTAL ULTRASOUND
DOPPLER ULTRASOUND OF THE TESTICLES
TECHNIQUE: Complete ultrasound examination of the testicles, epididymis, and
other scrotal structures was performed. Color and spectral Doppler
ultrasound were also utilized to evaluate blood flow to the
testicles.

[Series 1: us art/ven abd/pelv/scrotum doppler ltd · 0.06mm/px · 13 of 67 slices shown]
[im 1/67]
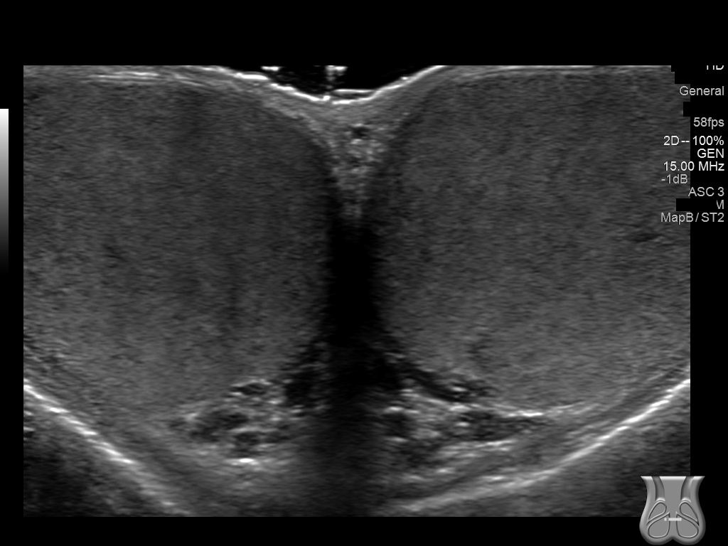
[im 6/67]
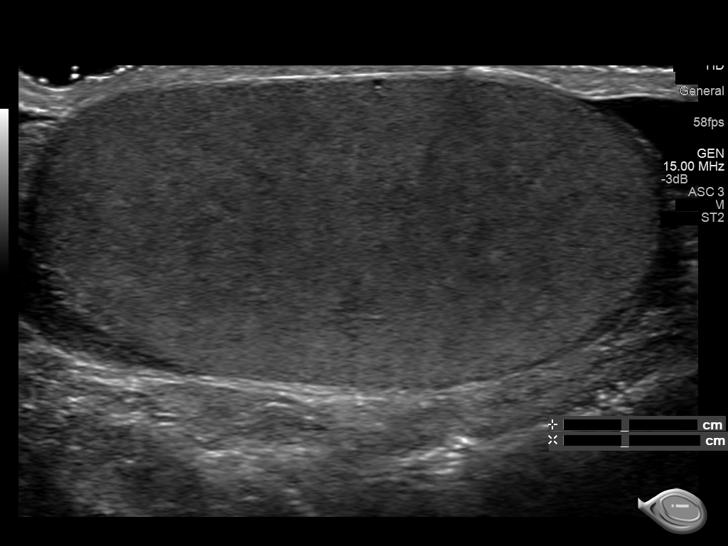
[im 12/67]
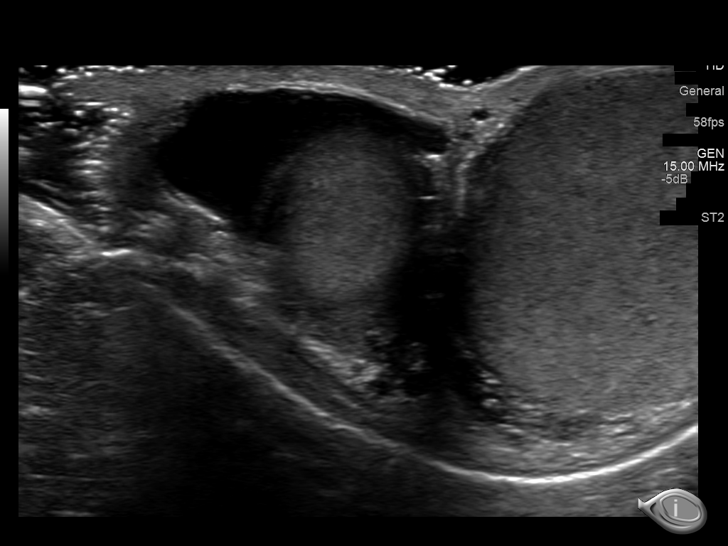
[im 17/67]
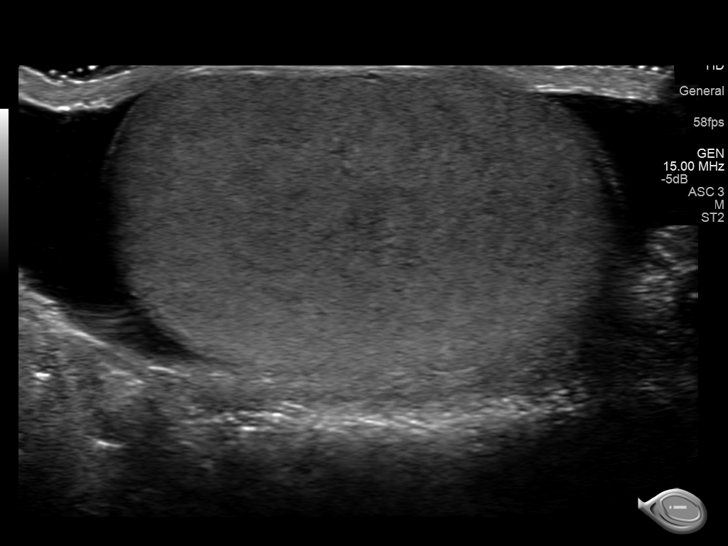
[im 23/67]
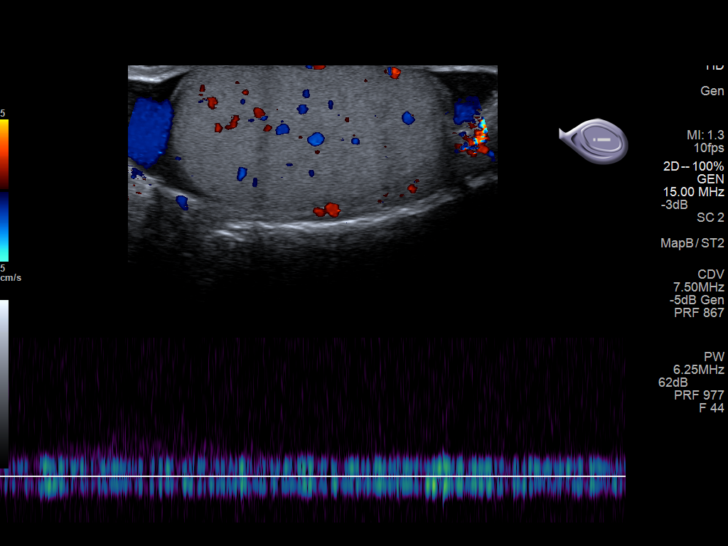
[im 28/67]
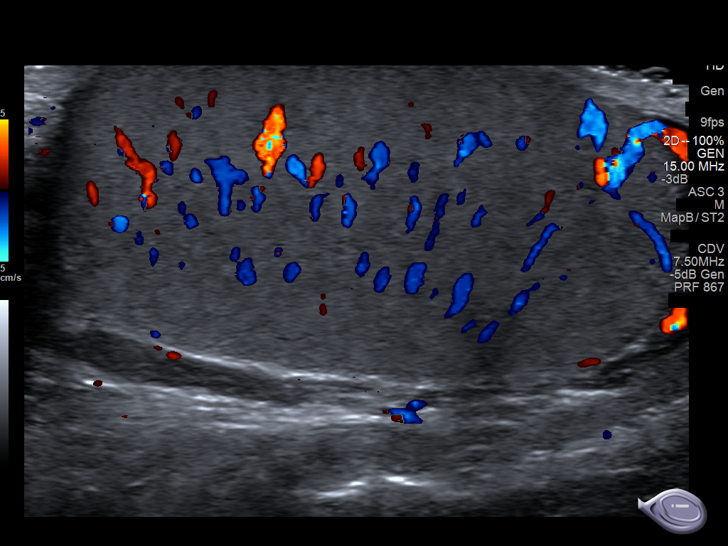
[im 34/67]
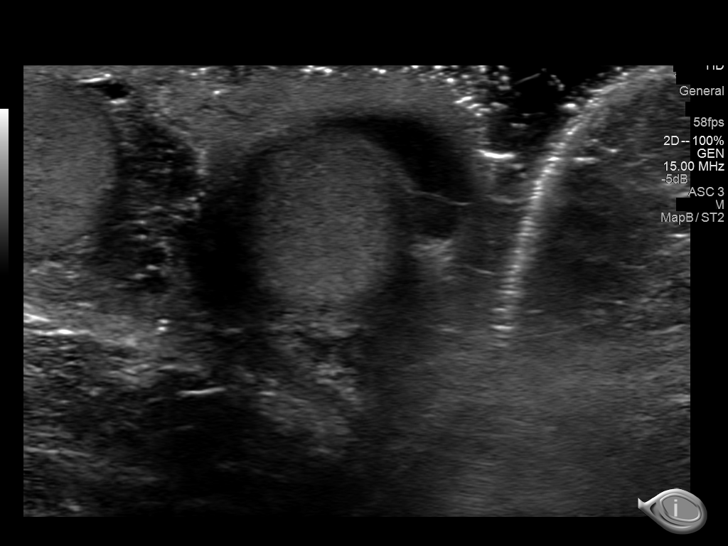
[im 39/67]
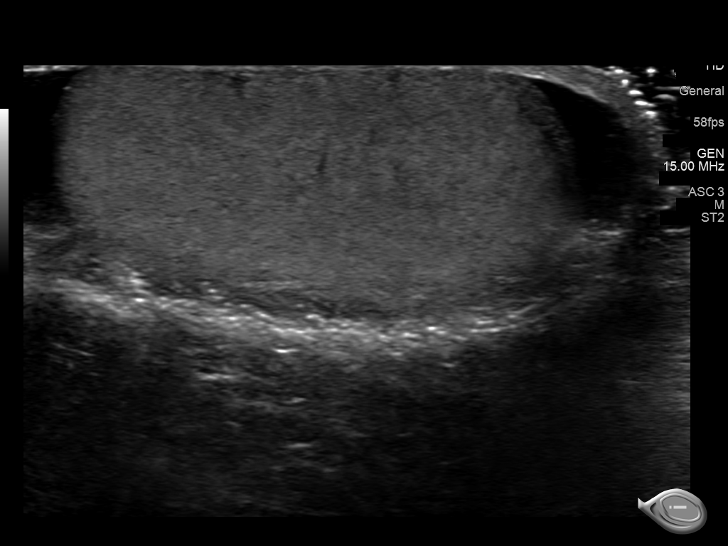
[im 45/67]
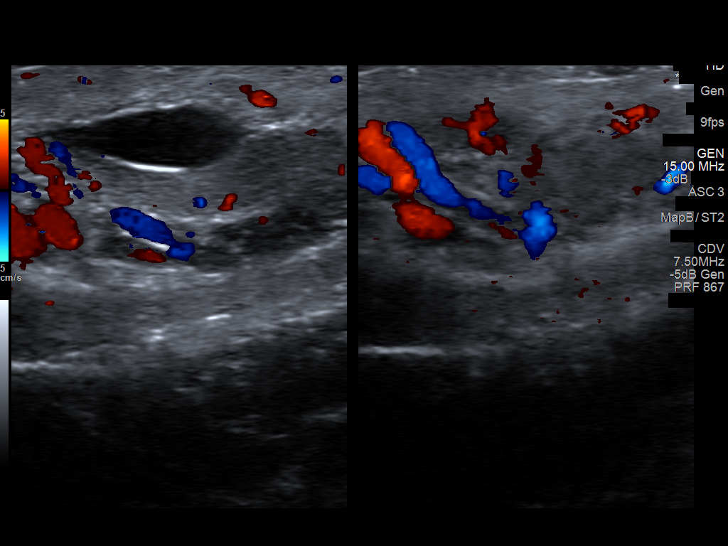
[im 50/67]
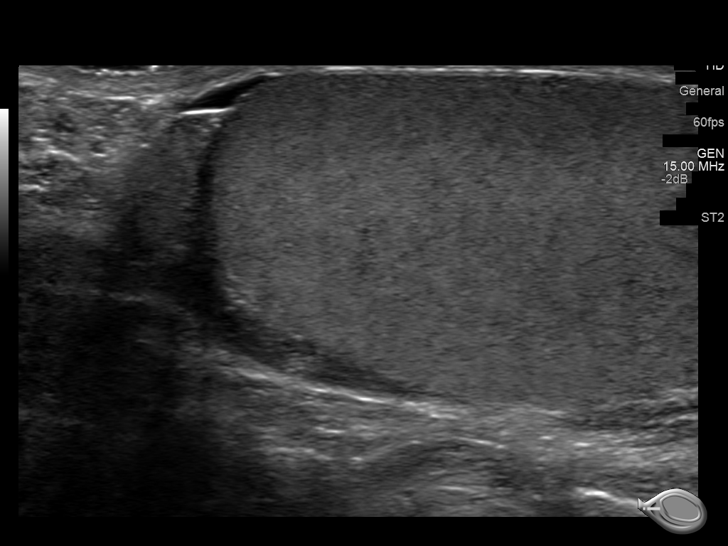
[im 56/67]
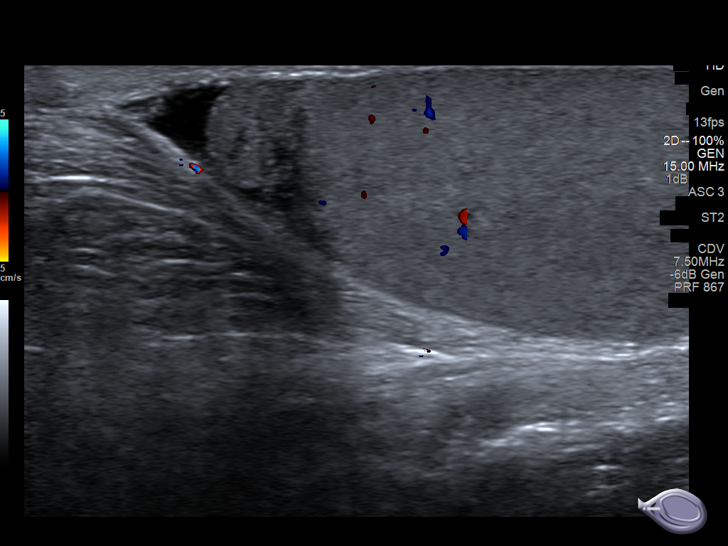
[im 61/67]
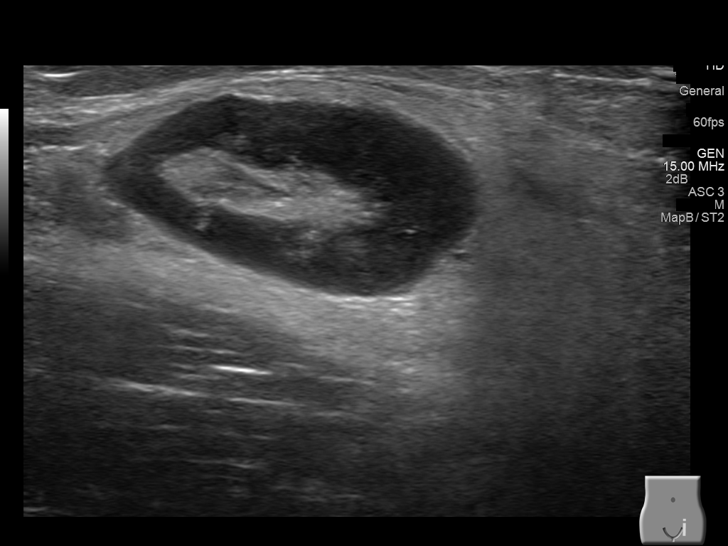
[im 67/67]
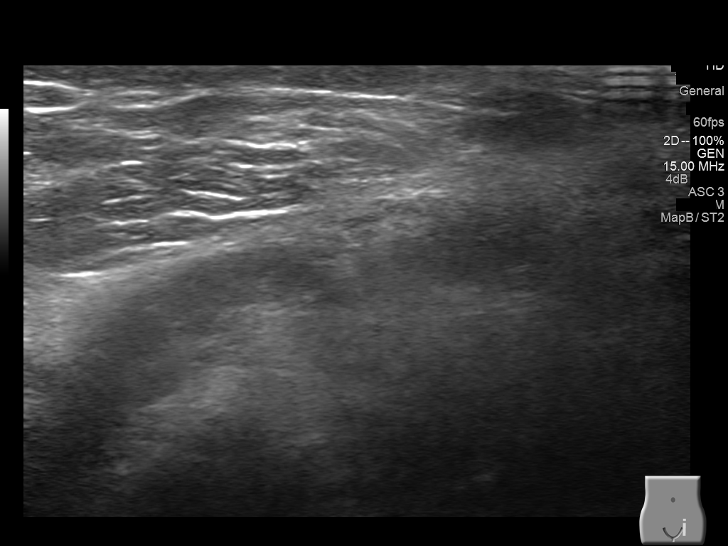

[13 of 25 positions shown; findings below may reference images not displayed]

FINDINGS: Right testicle

Measurements: 5.3 x 2.6 x 3.2 cm. No mass or microlithiasis
visualized.

Left testicle

Measurements: 5.4 x 2.8 x 3.2 cm. No mass or microlithiasis
visualized.

Right epididymis:  Normal in size and appearance.

Left epididymis:  Normal in size and appearance.

Hydrocele:  Small bilateral hydroceles.

Varicocele:  None visualized.

Pulsed Doppler interrogation of both testes demonstrates normal low
resistance arterial and venous waveforms bilaterally. Normal
homogeneous flow is demonstrated in both testes and epididymides on
color flow Doppler imaging.

Images obtained of the left groin corresponding to the area of pain
in bulging demonstrate prominent hypervascular lymph nodes measuring
up to 3.2 x 1.6 x 1.7 cm. Lymph nodes demonstrate fatty hila with
central flow consistent with inflammatory nodes. These changes are
also demonstrated on the previous CT scan.
IMPRESSION: Normal sound appearance of the testicles. No evidence of testicular
mass or torsion. Small bilateral hydroceles. Incidental note of
prominent lymph nodes in the left groin region, likely inflammatory.

## 2016-05-18 ENCOUNTER — Inpatient Hospital Stay (HOSPITAL_COMMUNITY)
Admission: EM | Admit: 2016-05-18 | Discharge: 2016-05-22 | DRG: 908 | Disposition: A | Discharge status: Home / Self Care | Payer: Worker's Compensation | Attending: Orthopaedic Surgery | Admitting: Orthopaedic Surgery

## 2016-05-18 ENCOUNTER — Observation Stay (HOSPITAL_COMMUNITY): Payer: Worker's Compensation

## 2016-05-18 ENCOUNTER — Encounter (HOSPITAL_COMMUNITY): Payer: Self-pay

## 2016-05-18 ENCOUNTER — Emergency Department (HOSPITAL_COMMUNITY): Payer: Worker's Compensation

## 2016-05-18 DIAGNOSIS — Y990 Civilian activity done for income or pay: Secondary | ICD-10-CM

## 2016-05-18 DIAGNOSIS — S93321A Subluxation of tarsometatarsal joint of right foot, initial encounter: Secondary | ICD-10-CM | POA: Diagnosis present

## 2016-05-18 DIAGNOSIS — F17200 Nicotine dependence, unspecified, uncomplicated: Secondary | ICD-10-CM | POA: Diagnosis present

## 2016-05-18 DIAGNOSIS — W240XXA Contact with lifting devices, not elsewhere classified, initial encounter: Secondary | ICD-10-CM | POA: Diagnosis present

## 2016-05-18 DIAGNOSIS — S9781XA Crushing injury of right foot, initial encounter: Secondary | ICD-10-CM

## 2016-05-18 DIAGNOSIS — S92321A Displaced fracture of second metatarsal bone, right foot, initial encounter for closed fracture: Secondary | ICD-10-CM

## 2016-05-18 DIAGNOSIS — S93326A Dislocation of tarsometatarsal joint of unspecified foot, initial encounter: Secondary | ICD-10-CM

## 2016-05-18 DIAGNOSIS — D693 Immune thrombocytopenic purpura: Secondary | ICD-10-CM | POA: Diagnosis present

## 2016-05-18 LAB — CBC
HCT: 43.2 % (ref 39.0–52.0)
Hemoglobin: 14.8 g/dL (ref 13.0–17.0)
MCH: 36 pg — ABNORMAL HIGH (ref 26.0–34.0)
MCHC: 34.3 g/dL (ref 30.0–36.0)
MCV: 105.1 fL — ABNORMAL HIGH (ref 78.0–100.0)
Platelets: 51 K/uL — ABNORMAL LOW (ref 150–400)
RBC: 4.11 MIL/uL — ABNORMAL LOW (ref 4.22–5.81)
RDW: 12.4 % (ref 11.5–15.5)
WBC: 7.3 K/uL (ref 4.0–10.5)

## 2016-05-18 LAB — PROTIME-INR
INR: 1.01
Prothrombin Time: 13.3 s (ref 11.4–15.2)

## 2016-05-18 LAB — BASIC METABOLIC PANEL WITH GFR
Anion gap: 10 (ref 5–15)
BUN: 13 mg/dL (ref 6–20)
CO2: 24 mmol/L (ref 22–32)
Calcium: 9.1 mg/dL (ref 8.9–10.3)
Chloride: 103 mmol/L (ref 101–111)
Creatinine, Ser: 1.02 mg/dL (ref 0.61–1.24)
GFR calc Af Amer: >60 mL/min
GFR calc non Af Amer: >60 mL/min
Glucose, Bld: 86 mg/dL (ref 65–99)
Potassium: 3.9 mmol/L (ref 3.5–5.1)
Sodium: 137 mmol/L (ref 135–145)

## 2016-05-18 MED ORDER — OXYCODONE-ACETAMINOPHEN 5-325 MG PO TABS
2.0000 | ORAL_TABLET | ORAL | Status: DC | PRN
  Administered 2016-05-18 – 2016-05-20 (×7): 2 via ORAL
  Filled 2016-05-18 (×7): qty 2

## 2016-05-18 MED ORDER — SODIUM CHLORIDE 0.9 % IV BOLUS (SEPSIS)
500.0000 mL | Freq: Once | INTRAVENOUS | Status: AC
  Administered 2016-05-18: 500 mL via INTRAVENOUS

## 2016-05-18 MED ORDER — FENTANYL CITRATE (PF) 100 MCG/2ML IJ SOLN
100.0000 ug | Freq: Once | INTRAMUSCULAR | Status: AC
  Administered 2016-05-18: 100 ug via INTRAVENOUS
  Filled 2016-05-18: qty 2

## 2016-05-18 MED ORDER — DIPHENHYDRAMINE HCL 25 MG PO CAPS
25.0000 mg | ORAL_CAPSULE | Freq: Once | ORAL | Status: AC
Start: 2016-05-18 — End: 2016-05-18
  Administered 2016-05-18: 25 mg via ORAL
  Filled 2016-05-18: qty 1

## 2016-05-18 MED ORDER — BACITRACIN ZINC 500 UNIT/GM EX OINT
1.0000 "application " | TOPICAL_OINTMENT | Freq: Two times a day (BID) | CUTANEOUS | Status: DC
  Administered 2016-05-18: 1 via TOPICAL
  Filled 2016-05-18: qty 0.9
  Filled 2016-05-18: qty 28.35

## 2016-05-18 MED ORDER — ONDANSETRON 4 MG PO TBDP
4.0000 mg | ORAL_TABLET | ORAL | Status: DC | PRN
  Administered 2016-05-18: 4 mg via ORAL
  Filled 2016-05-18 (×2): qty 1

## 2016-05-18 MED ORDER — DIAZEPAM 5 MG/ML IJ SOLN
5.0000 mg | Freq: Once | INTRAMUSCULAR | Status: AC
  Administered 2016-05-18: 5 mg via INTRAVENOUS

## 2016-05-18 MED ORDER — MORPHINE SULFATE (PF) 4 MG/ML IV SOLN
4.0000 mg | Freq: Once | INTRAVENOUS | Status: DC
  Filled 2016-05-18: qty 1

## 2016-05-18 MED ORDER — DIAZEPAM 5 MG/ML IJ SOLN
2.5000 mg | Freq: Once | INTRAMUSCULAR | Status: DC
  Filled 2016-05-18: qty 2

## 2016-05-18 MED ORDER — MORPHINE SULFATE (PF) 4 MG/ML IV SOLN
4.0000 mg | INTRAVENOUS | Status: AC
  Administered 2016-05-18 (×2): 4 mg via INTRAVENOUS
  Filled 2016-05-18: qty 1

## 2016-05-18 MED ORDER — ONDANSETRON HCL 4 MG/2ML IJ SOLN: 4.0000 mg | Freq: Three times a day (TID) | INTRAMUSCULAR | Status: AC | PRN

## 2016-05-18 MED ORDER — ONDANSETRON 4 MG PO TBDP
4.0000 mg | ORAL_TABLET | Freq: Three times a day (TID) | ORAL | Status: DC | PRN
  Filled 2016-05-18: qty 1

## 2016-05-18 MED ORDER — OXYCODONE-ACETAMINOPHEN 5-325 MG PO TABS
2.0000 | ORAL_TABLET | Freq: Once | ORAL | Status: AC
  Administered 2016-05-18: 2 via ORAL
  Filled 2016-05-18: qty 2

## 2016-05-18 MED ORDER — HYDROMORPHONE HCL 1 MG/ML IJ SOLN
1.0000 mg | INTRAMUSCULAR | Status: AC | PRN
  Administered 2016-05-18 – 2016-05-19 (×3): 1 mg via INTRAVENOUS
  Filled 2016-05-18 (×3): qty 1

## 2016-05-18 NOTE — H&P (Signed)
Christian Roberson is an 35 y.o. male.   Chief Complaint: OTJI with forklift rolled over his right foot. With 2nd MT Fx and widening of 2nd 3rd MT space, ? 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 Forklift moved off his foot and he removed boot with severe pain . EMS to ED with above injuries noted on initial xray.   CT pending. Chronic idiopathic thrombocytopenia with platelet count 51K.   Past Medical History:  Diagnosis Date  . Chronic idiopathic thrombocytopenia (HCC)     Past Surgical History:  Procedure Laterality Date  . HAND SURGERY      Family History  Problem Relation Age of Onset  . Leukemia Cousin   . Leukemia Cousin   . Leukemia Cousin   . Leukemia Maternal Uncle    Social History:  reports that he has been smoking.  He does not have any smokeless tobacco history on file. He reports that he drinks alcohol. His drug history is not on file.  Allergies: No Known Allergies  Medications Prior to Admission  Medication Sig Dispense Refill  . amoxicillin-clavulanate (AUGMENTIN) 875-125 MG per tablet Take 1 tablet by mouth 2 (two) times daily. 14 tablet 0  . ibuprofen (MOTRIN IB) 200 MG tablet Take 3 tablets (600 mg total) by mouth every 6 (six) hours as needed. (Patient not taking: Reported on 05/18/2016) 60 tablet 0  . oxyCODONE-acetaminophen (ROXICET) 5-325 MG per tablet Take 1 tablet by mouth every 6 (six) hours as needed for severe pain. (Patient not taking: Reported on 05/18/2016) 12 tablet 0    Results for orders placed or performed during the hospital encounter of 05/18/16 (from the past 48 hour(s))  CBC     Status: Abnormal   Collection Time: 05/18/16  2:15 PM  Result Value Ref Range   WBC 7.3 4.0 - 10.5 K/uL   RBC 4.11 (L) 4.22 - 5.81 MIL/uL   Hemoglobin 14.8 13.0 - 17.0 g/dL   HCT 43.2 39.0 - 52.0 %   MCV 105.1 (H) 78.0 - 100.0 fL   MCH 36.0 (H) 26.0 - 34.0 pg   MCHC 34.3 30.0 - 36.0 g/dL   RDW 12.4 11.5 - 15.5 %   Platelets 51 (L) 150 -  400 K/uL    Comment: PLATELET COUNT CONFIRMED BY SMEAR REPEATED TO VERIFY   Basic metabolic panel     Status: None   Collection Time: 05/18/16  2:15 PM  Result Value Ref Range   Sodium 137 135 - 145 mmol/L   Potassium 3.9 3.5 - 5.1 mmol/L   Chloride 103 101 - 111 mmol/L   CO2 24 22 - 32 mmol/L   Glucose, Bld 86 65 - 99 mg/dL   BUN 13 6 - 20 mg/dL   Creatinine, Ser 1.02 0.61 - 1.24 mg/dL   Calcium 9.1 8.9 - 10.3 mg/dL   GFR calc non Af Amer >60 >60 mL/min   GFR calc Af Amer >60 >60 mL/min    Comment: (NOTE) The eGFR has been calculated using the CKD EPI equation. This calculation has not been validated in all clinical situations. eGFR's persistently <60 mL/min signify possible Chronic Kidney Disease.    Anion gap 10 5 - 15  Protime-INR     Status: None   Collection Time: 05/18/16  2:15 PM  Result Value Ref Range   Prothrombin Time 13.3 11.4 - 15.2 seconds   INR 1.01    Dg Chest 2 View  Result Date: 05/18/2016  CLINICAL DATA:  Crush injury of the right foot. Preoperative examination. Buttocks EXAM: CHEST  2 VIEW COMPARISON:  Chest x-ray of April 06, 2004 FINDINGS: The lungs are well-expanded. There is no focal infiltrate. There is no pleural effusion. The heart and pulmonary vascularity are normal. The mediastinum is normal in width. There is curvature of the lower thoracic spine convex toward the right which is stable. IMPRESSION: There is no acute cardiopulmonary abnormality. Electronically Signed   By: David  Martinique M.D.   On: 05/18/2016 14:46   Dg Tibia/fibula Right  Result Date: 05/18/2016 CLINICAL DATA:  Pain after being run over by a forklift. EXAM: RIGHT TIBIA AND FIBULA - 2 VIEW COMPARISON:  None. FINDINGS: No acute fracture or dislocation. Slight spurring at the tip of the medial malleolus. Possible small osteochondral lesion of the medial aspect of the dome of the talus. IMPRESSION: No acute abnormalities.  Arthritic changes of the ankle joint. Electronically Signed   By:  Lorriane Shire M.D.   On: 05/18/2016 10:57   Dg Knee Complete 4 Views Right  Result Date: 05/18/2016 CLINICAL DATA:  Right knee pain after being run over by a forklift. EXAM: RIGHT KNEE - COMPLETE 4+ VIEW COMPARISON:  None. FINDINGS: No evidence of fracture, dislocation, or joint effusion. No evidence of arthropathy or other focal bone abnormality. Soft tissues are unremarkable. IMPRESSION: Negative. Electronically Signed   By: Lorriane Shire M.D.   On: 05/18/2016 10:55   Dg Foot Complete Right  Result Date: 05/18/2016 CLINICAL DATA:  Right foot pain and swelling secondary to crush injury from being run over by a forklift. EXAM: RIGHT FOOT COMPLETE - 3+ VIEW COMPARISON:  None. FINDINGS: There is a displaced fracture of the proximal shaft of the second metatarsal. No other visible fractures. Dorsal spurring at the tarsal metatarsal joints and the talonavicular joint. No appreciable widening of the Lisfranc joint. There is suggestion of widening of the joint between the bases of the second and third metatarsals. IMPRESSION: Displaced transverse fracture of the shaft of the second metatarsal. Possible disruption of joint between the bases of the second and third metatarsals. Electronically Signed   By: Lorriane Shire M.D.   On: 05/18/2016 10:54    Review of Systems  Respiratory:       Smoker   Cardiovascular: Negative.   Gastrointestinal: Negative.   Genitourinary: Negative.   Musculoskeletal:       Previous hand surgery  Skin: Negative.   Endo/Heme/Allergies:       Thrombocytopenia idiopathic    Blood pressure 139/81, pulse 79, temperature 97.8 F (36.6 C), temperature source Oral, resp. rate 18, height 6' (1.829 m), weight 92.1 kg (203 lb), SpO2 100 %. Physical Exam  Constitutional: He is oriented to person, place, and time. He appears well-developed and well-nourished.  HENT:  Head: Normocephalic and atraumatic.  Eyes: Conjunctivae are normal. Pupils are equal, round, and reactive to  light.  Neck: Normal range of motion. Neck supple. No tracheal deviation present. No thyromegaly present.  Cardiovascular: Normal rate and regular rhythm.   Respiratory: Effort normal and breath sounds normal.  GI: Soft. Bowel sounds are normal.  Musculoskeletal:  Forefoot edema with abrasions. Good cap refill  Neurological: He is oriented to person, place, and time.  Psychiatric: He has a normal mood and affect. His behavior is normal. Thought content normal.     Assessment/Plan Left foot crush injury. Will order CT scan to evaluate possible LisFranc injury. Foot elevation above heart and possible surgery in  48 hrs on Wed.  Marybelle Killings, MD 05/18/2016, 6:32 PM

## 2016-05-18 NOTE — ED Triage Notes (Signed)
Pt brought in by EMS due to right foot being run over by a forklift. Pt denies any LOC. Per EMS pt does c/o of pain in right knee. Pt a&ox4. Pt received 100mcg of fentanyl and 4mg  of zofran enroute.

## 2016-05-18 NOTE — Progress Notes (Signed)
Orthopedic Tech Progress Note Patient Details:  Christian LangoBryan L Shepard 1981-05-10 [id number]  Ortho Devices Type of Ortho Device: Ace wrap, Post (short) splint, Knee Immobilizer Splint Material: Fiberglass Ortho Device/Splint Location: rle Ortho Device/Splint Interventions: Application   Eugene Zeiders 05/18/2016, 1:12 PM Pt will not need crutches aT THIS JUNCTURE AS HE has elected to remain in the hospital

## 2016-05-18 NOTE — Progress Notes (Signed)
Patient arrived to room from the ED. Safety precautions and order reviewed with patient. Pain med given per request. Dr. Ophelia CharterYates office called and left message with answering service. VSS. Will continue to monitor.   Sim BoastHavy, RN

## 2016-05-18 NOTE — ED Notes (Signed)
Attempted report.  Pt has not been assigned to a room.

## 2016-05-18 NOTE — ED Provider Notes (Signed)
[facility]-EMERGENCY DEPT Provider Note   CSN: [id number] Arrival date & time: 05/18/16  4098     History   Chief Complaint Chief Complaint  Patient presents with  . Foot Pain    HPI Christian Roberson is a 35 y.o. male.  HPI   Patient is a 35 year old male with history of chronic idiopathic thrombocytopenia, otherwise healthy, presents to emergency room with sudden onset right foot, right ankle and right knee pain which occurred just prior to arrival when a forklift ran over his right foot. He was rolled off of his foot and he immediately removed his shoe.  He had abrasions over the top of his foot and anterior ankle and some gradually progressing swelling and bruising to the dorsum of his right foot with severe pain. He was brought to the ER via EMS and was given fentanyl en route without much improvement his severe pain.  Upon arrival he complains of 9/10 pain that is throbbing and cramping radiating from his right foot all the way up to his right thigh and behind his knee.  Past Medical History:  Diagnosis Date  . Chronic idiopathic thrombocytopenia [facility])     Patient Active Problem List   Diagnosis Date Noted  . Crush injury of right foot 05/18/2016    Past Surgical History:  Procedure Laterality Date  . HAND SURGERY       Home Medications    Prior to Admission medications   Medication Sig Start Date End Date Taking? Authorizing Provider  amoxicillin-clavulanate (AUGMENTIN) 875-125 MG per tablet Take 1 tablet by mouth 2 (two) times daily. 05/02/15   Jeralyn Ruths, MD  ibuprofen (MOTRIN IB) 200 MG tablet Take 3 tablets (600 mg total) by mouth every 6 (six) hours as needed. Patient not taking: Reported on 05/18/2016 04/19/15   Sharman Cheek, MD  oxyCODONE-acetaminophen (ROXICET) 5-325 MG per tablet Take 1 tablet by mouth every 6 (six) hours as needed for severe pain. Patient not taking: Reported on 05/18/2016 04/19/15   Sharman Cheek, MD    Family History Family History    Problem Relation Age of Onset  . Leukemia Cousin   . Leukemia Cousin   . Leukemia Cousin   . Leukemia Maternal Uncle     Social History Social History  Substance Use Topics  . Smoking status: Current Some Day Smoker  . Smokeless tobacco: Not on file  . Alcohol use Yes     Allergies   Review of patient's allergies indicates no known allergies.   Review of Systems Review of Systems  All other systems reviewed and are negative.    Physical Exam Updated Vital Signs BP 130/62   Pulse 88   Temp 97.5 F (36.4 C) (Oral)   Resp 15   Ht 6' (1.829 m)   Wt 92.1 kg   SpO2 99%   BMI 27.53 kg/m   Physical Exam  Constitutional: He is oriented to person, place, and time. He appears well-developed and well-nourished. No distress.  HENT:  Head: Normocephalic and atraumatic.  Right Ear: External ear normal.  Left Ear: External ear normal.  Nose: Nose normal.  Mouth/Throat: Oropharynx is clear and moist. No oropharyngeal exudate.  Eyes: Conjunctivae and EOM are normal. Pupils are equal, round, and reactive to light. Right eye exhibits no discharge. Left eye exhibits no discharge. No scleral icterus.  Neck: Normal range of motion. Neck supple.  Cardiovascular: Normal rate, regular rhythm, normal heart sounds and intact distal pulses.  Exam reveals no gallop  and no friction rub.   No murmur heard. Pulmonary/Chest: Effort normal and breath sounds normal. No stridor. No respiratory distress. He has no wheezes. He has no rales. He exhibits no tenderness.  Abdominal: Soft. Bowel sounds are normal. He exhibits no distension and no mass. There is no tenderness. There is no guarding.  Musculoskeletal: He exhibits edema and tenderness.       Right knee: He exhibits decreased range of motion and effusion. He exhibits no swelling, no ecchymosis, no deformity, no laceration, no erythema and normal alignment. Tenderness found.       Right ankle: He exhibits decreased range of motion and  swelling. He exhibits normal pulse. Tenderness. Lateral malleolus tenderness found. No medial malleolus tenderness found.       Right upper leg: He exhibits tenderness. He exhibits no bony tenderness, no swelling and no edema.       Right lower leg: He exhibits tenderness. He exhibits no bony tenderness, no swelling and no edema.       Legs:      Right foot: There is decreased range of motion, tenderness, bony tenderness and swelling. There is normal capillary refill, no crepitus, no deformity and no laceration.  Generalized ttp to right leg from thigh to toes Muscle compartments soft in right thigh and calf   Neurological: He is alert and oriented to person, place, and time. He exhibits normal muscle tone. Coordination normal.  Normal sensation to light touch to right foot and toes  Skin: Skin is warm. Capillary refill takes less than 2 seconds. Abrasion and bruising noted. No laceration and no rash noted. He is diaphoretic. No cyanosis or erythema. No pallor. Nails show no clubbing.  Psychiatric: He has a normal mood and affect. His behavior is normal. Judgment and thought content normal.  Nursing note and vitals reviewed.      ED Treatments / Results  Labs (all labs ordered are listed, but only abnormal results are displayed) Labs Reviewed  CBC - Abnormal; Notable for the following:       Result Value   RBC 4.11 (*)    MCV 105.1 (*)    MCH 36.0 (*)    Platelets 51 (*)    All other components within normal limits  BASIC METABOLIC PANEL  PROTIME-INR    EKG  EKG Interpretation None       Radiology Dg Chest 2 View  Result Date: 05/18/2016 CLINICAL DATA:  Crush injury of the right foot. Preoperative examination. Buttocks EXAM: CHEST  2 VIEW COMPARISON:  Chest x-ray of April 06, 2004 FINDINGS: The lungs are well-expanded. There is no focal infiltrate. There is no pleural effusion. The heart and pulmonary vascularity are normal. The mediastinum is normal in width. There is  curvature of the lower thoracic spine convex toward the right which is stable. IMPRESSION: There is no acute cardiopulmonary abnormality. Electronically Signed   By: David  [place] M.D.   On: 05/18/2016 14:46   Dg Tibia/fibula Right  Result Date: 05/18/2016 CLINICAL DATA:  Pain after being run over by a forklift. EXAM: RIGHT TIBIA AND FIBULA - 2 VIEW COMPARISON:  None. FINDINGS: No acute fracture or dislocation. Slight spurring at the tip of the medial malleolus. Possible small osteochondral lesion of the medial aspect of the dome of the talus. IMPRESSION: No acute abnormalities.  Arthritic changes of the ankle joint. Electronically Signed   By: Francene Boyers M.D.   On: 05/18/2016 10:57   Dg Knee Complete 4 Views Right  Result Date: 05/18/2016 CLINICAL DATA:  Right knee pain after being run over by a forklift. EXAM: RIGHT KNEE - COMPLETE 4+ VIEW COMPARISON:  None. FINDINGS: No evidence of fracture, dislocation, or joint effusion. No evidence of arthropathy or other focal bone abnormality. Soft tissues are unremarkable. IMPRESSION: Negative. Electronically Signed   By: Francene Boyers M.D.   On: 05/18/2016 10:55   Dg Foot Complete Right  Result Date: 05/18/2016 CLINICAL DATA:  Right foot pain and swelling secondary to crush injury from being run over by a forklift. EXAM: RIGHT FOOT COMPLETE - 3+ VIEW COMPARISON:  None. FINDINGS: There is a displaced fracture of the proximal shaft of the second metatarsal. No other visible fractures. Dorsal spurring at the tarsal metatarsal joints and the talonavicular joint. No appreciable widening of the Lisfranc joint. There is suggestion of widening of the joint between the bases of the second and third metatarsals. IMPRESSION: Displaced transverse fracture of the shaft of the second metatarsal. Possible disruption of joint between the bases of the second and third metatarsals. Electronically Signed   By: Francene Boyers M.D.   On: 05/18/2016 10:54     Procedures Procedures (including critical care time)  Medications Ordered in ED Medications  bacitracin ointment 1 application (1 application Topical Given 05/18/16 1239)  ondansetron (ZOFRAN-ODT) disintegrating tablet 4 mg (4 mg Oral Given 05/18/16 1239)  sodium chloride 0.9 % bolus 500 mL (0 mLs Intravenous Stopped 05/18/16 1239)  diazepam (VALIUM) injection 5 mg (5 mg Intravenous Given 05/18/16 1009)  morphine 4 MG/ML injection 4 mg (4 mg Intravenous Given 05/18/16 1057)  oxyCODONE-acetaminophen (PERCOCET/ROXICET) 5-325 MG per tablet 2 tablet (2 tablets Oral Given 05/18/16 1238)  fentaNYL (SUBLIMAZE) injection 100 mcg (100 mcg Intravenous Given 05/18/16 1248)  diphenhydrAMINE (BENADRYL) capsule 25 mg (25 mg Oral Given 05/18/16 1517)     Initial Impression / Assessment and Plan / ED Course  I have reviewed the triage vital signs and the nursing notes.  Pertinent labs & imaging results that were available during my care of the patient were reviewed by me and considered in my medical decision making (see chart for details).  Clinical Course   Pt with crush injury to right foot and ankle with Pain from right thigh to right foot.  Injury occurred just prior to arrival forklift drove over his right foot from his right his left, he was able to roll the forklift off of him and remove his shoe. EMS was contacted and patient was transported to the ER with minimal relief of his pain with 100 of fentanyl.  His abrasions to the dorsum of his right foot and anterior ankle, with swelling and ecchymosis, no obvious deformity. Generalized tenderness to palpation to right leg, all muscle compartment soft. Right foot and toes have normal sensation to light touch, normal capillary refill, no subungual hematomas. X-rays are pertinent for displaced transverse fracture of the shaft of the second metatarsal, and possible disruption of joint between second and third metatarsals.    Extremities elevated and ice  applied, he has had multiple doses of pain medications without much control of his pain.  Case discussed with attending physician Dr. Fayrene Fearing, agrees with plan, Marte/bulky padding with short posterior splint applied, right knee immobilized.    Discussed with Dr. Ophelia Charter who will admit for pain control and possible surgery.  He requested pre-op labs and hold orders.  Pt became itchy after percocet, given benadryl.  Medications  bacitracin ointment 1 application (1 application Topical Given 05/18/16  1239)  ondansetron (ZOFRAN-ODT) disintegrating tablet 4 mg (4 mg Oral Given 05/18/16 1239)  sodium chloride 0.9 % bolus 500 mL (0 mLs Intravenous Stopped 05/18/16 1239)  diazepam (VALIUM) injection 5 mg (5 mg Intravenous Given 05/18/16 1009)  morphine 4 MG/ML injection 4 mg (4 mg Intravenous Given 05/18/16 1057)  oxyCODONE-acetaminophen (PERCOCET/ROXICET) 5-325 MG per tablet 2 tablet (2 tablets Oral Given 05/18/16 1238)  fentaNYL (SUBLIMAZE) injection 100 mcg (100 mcg Intravenous Given 05/18/16 1248)  diphenhydrAMINE (BENADRYL) capsule 25 mg (25 mg Oral Given 05/18/16 1517)   Vitals:   05/18/16 1230 05/18/16 1315 05/18/16 1330 05/18/16 1345  BP: 128/74 123/78 93/80 130/62  Pulse: 77 95 92 88  Resp: 16 16  15   Temp:      TempSrc:      SpO2: 96% 96% 97% 99%  Weight:      Height:       Pt had sips and chips when he first arrived to the ER, and was then NPO since 10:30 am  Final Clinical Impressions(s) / ED Diagnoses   Final diagnoses:  Closed displaced fracture of second metatarsal bone of right foot, initial encounter  Crush injury of right foot, initial encounter    New Prescriptions New Prescriptions   No medications on file     Danelle BerryLeisa Nieko Clarin, PA-C 05/18/16 1602    Rolland PorterMark James, MD 05/22/16 [phone]

## 2016-05-18 NOTE — ED Notes (Signed)
Patient transported to X-ray 

## 2016-05-18 NOTE — Progress Notes (Signed)
Dr. Ophelia CharterYates called back with order to resume diet. No surgery tonight. Pt notified.    Sim BoastHavy, RN

## 2016-05-19 ENCOUNTER — Other Ambulatory Visit: Payer: Self-pay | Admitting: Orthopaedic Surgery

## 2016-05-19 DIAGNOSIS — S93321A Subluxation of tarsometatarsal joint of right foot, initial encounter: Secondary | ICD-10-CM | POA: Diagnosis present

## 2016-05-19 DIAGNOSIS — S9781XA Crushing injury of right foot, initial encounter: Secondary | ICD-10-CM | POA: Diagnosis present

## 2016-05-19 DIAGNOSIS — S92321A Displaced fracture of second metatarsal bone, right foot, initial encounter for closed fracture: Secondary | ICD-10-CM | POA: Diagnosis present

## 2016-05-19 DIAGNOSIS — F17200 Nicotine dependence, unspecified, uncomplicated: Secondary | ICD-10-CM | POA: Diagnosis present

## 2016-05-19 DIAGNOSIS — W240XXA Contact with lifting devices, not elsewhere classified, initial encounter: Secondary | ICD-10-CM | POA: Diagnosis present

## 2016-05-19 DIAGNOSIS — D693 Immune thrombocytopenic purpura: Secondary | ICD-10-CM | POA: Diagnosis present

## 2016-05-19 DIAGNOSIS — Y990 Civilian activity done for income or pay: Secondary | ICD-10-CM | POA: Diagnosis not present

## 2016-05-19 LAB — SURGICAL PCR SCREEN
MRSA, PCR: NEGATIVE
Staphylococcus aureus: NEGATIVE

## 2016-05-19 MED ORDER — DIPHENHYDRAMINE HCL 25 MG PO CAPS
25.0000 mg | ORAL_CAPSULE | Freq: Four times a day (QID) | ORAL | Status: DC | PRN
  Administered 2016-05-19 – 2016-05-21 (×4): 25 mg via ORAL
  Filled 2016-05-19 (×5): qty 1

## 2016-05-19 MED ORDER — HYDROMORPHONE HCL 1 MG/ML IJ SOLN
0.5000 mg | INTRAMUSCULAR | Status: DC | PRN
  Administered 2016-05-19 – 2016-05-20 (×6): 1 mg via INTRAVENOUS
  Filled 2016-05-19 (×6): qty 1

## 2016-05-19 MED ORDER — HYDROMORPHONE BOLUS VIA INFUSION: 1.0000 mg | INTRAVENOUS | Status: DC | PRN

## 2016-05-19 NOTE — Progress Notes (Signed)
Pt called RN to room d/t swelling of R foot. On assessment, pt reports new onset numbness/tingling in the last hour, has cap refill in RLE greater than 3 seconds, and significant edema. He can wiggle his toes slightly, and says IV pain medication helped with pain this morning. Ice was applied and foot elevated. Dr. Ophelia CharterYates was notified, he instructed to elevate feet as much as possible and continue ice. Nursing will continue to monitor.     [place], Latricia HeftKorie G

## 2016-05-19 NOTE — Progress Notes (Signed)
Patient ID: Christian Roberson, male   DOB: 08-09-1981, 35 y.o.   MRN: [id number] Surgery Wed afternoon for 2nd MT ORIF and Lisfranc fixation.

## 2016-05-19 NOTE — Progress Notes (Signed)
05/19/16  1500  IV team called to place IV. Two RN's looked with one attempt.

## 2016-05-20 ENCOUNTER — Inpatient Hospital Stay (HOSPITAL_COMMUNITY): Payer: Worker's Compensation | Admitting: Certified Registered Nurse Anesthetist

## 2016-05-20 ENCOUNTER — Encounter (HOSPITAL_COMMUNITY): Payer: Self-pay | Admitting: Certified Registered Nurse Anesthetist

## 2016-05-20 ENCOUNTER — Encounter (HOSPITAL_COMMUNITY)
Admission: EM | Disposition: A | Discharge status: Home / Self Care | Payer: Self-pay | Source: Home / Self Care | Attending: Orthopaedic Surgery

## 2016-05-20 HISTORY — PX: ORIF TOE FRACTURE: SHX5032

## 2016-05-20 LAB — ABO/RH: ABO/RH(D): O POS

## 2016-05-20 LAB — TYPE AND SCREEN
ABO/RH(D): O POS
Antibody Screen: NEGATIVE

## 2016-05-20 SURGERY — OPEN REDUCTION INTERNAL FIXATION (ORIF) METATARSAL (TOE) FRACTURE
Anesthesia: General | Laterality: Right

## 2016-05-20 MED ORDER — LACTATED RINGERS IV SOLN
INTRAVENOUS | Status: DC
  Administered 2016-05-20 (×2): via INTRAVENOUS

## 2016-05-20 MED ORDER — ACETAMINOPHEN 650 MG RE SUPP: 650.0000 mg | Freq: Four times a day (QID) | RECTAL | Status: DC | PRN

## 2016-05-20 MED ORDER — ROCURONIUM BROMIDE 10 MG/ML (PF) SYRINGE
PREFILLED_SYRINGE | INTRAVENOUS | Status: AC
  Filled 2016-05-20: qty 10

## 2016-05-20 MED ORDER — SODIUM CHLORIDE 0.9 % IV SOLN
INTRAVENOUS | Status: DC
  Administered 2016-05-21: 04:00:00 via INTRAVENOUS

## 2016-05-20 MED ORDER — ACETAMINOPHEN 325 MG PO TABS
ORAL_TABLET | ORAL | Status: AC
Start: 2016-05-20 — End: 2016-05-21
  Filled 2016-05-20: qty 2

## 2016-05-20 MED ORDER — CEFAZOLIN IN D5W 1 GM/50ML IV SOLN
1.0000 g | Freq: Three times a day (TID) | INTRAVENOUS | Status: AC
  Administered 2016-05-20 – 2016-05-21 (×3): 1 g via INTRAVENOUS
  Filled 2016-05-20 (×3): qty 50

## 2016-05-20 MED ORDER — LIDOCAINE HCL (CARDIAC) 20 MG/ML IV SOLN
INTRAVENOUS | Status: DC | PRN
  Administered 2016-05-20: 80 mg via INTRAVENOUS

## 2016-05-20 MED ORDER — ONDANSETRON HCL 4 MG/2ML IJ SOLN: 4.0000 mg | Freq: Four times a day (QID) | INTRAMUSCULAR | Status: DC | PRN

## 2016-05-20 MED ORDER — PROPOFOL 10 MG/ML IV BOLUS
INTRAVENOUS | Status: DC | PRN
  Administered 2016-05-20: 150 mg via INTRAVENOUS

## 2016-05-20 MED ORDER — HYDROMORPHONE HCL 1 MG/ML IJ SOLN
INTRAMUSCULAR | Status: AC
  Filled 2016-05-20: qty 1

## 2016-05-20 MED ORDER — HYDROMORPHONE HCL 1 MG/ML IJ SOLN
INTRAMUSCULAR | Status: DC | PRN
  Administered 2016-05-20 (×2): 0.5 mg via INTRAVENOUS

## 2016-05-20 MED ORDER — FENTANYL CITRATE (PF) 100 MCG/2ML IJ SOLN
INTRAMUSCULAR | Status: AC
  Filled 2016-05-20: qty 4

## 2016-05-20 MED ORDER — POVIDONE-IODINE 10 % EX SWAB: 2.0000 "application " | Freq: Once | CUTANEOUS | Status: DC

## 2016-05-20 MED ORDER — ONDANSETRON HCL 4 MG/2ML IJ SOLN
INTRAMUSCULAR | Status: DC | PRN
  Administered 2016-05-20: 4 mg via INTRAVENOUS

## 2016-05-20 MED ORDER — MIDAZOLAM HCL 2 MG/2ML IJ SOLN
INTRAMUSCULAR | Status: AC
  Filled 2016-05-20: qty 2

## 2016-05-20 MED ORDER — ONDANSETRON HCL 4 MG PO TABS: 4.0000 mg | ORAL_TABLET | Freq: Four times a day (QID) | ORAL | Status: DC | PRN

## 2016-05-20 MED ORDER — CEFAZOLIN SODIUM-DEXTROSE 2-4 GM/100ML-% IV SOLN
2.0000 g | INTRAVENOUS | Status: AC
  Administered 2016-05-20: 2 g via INTRAVENOUS
  Filled 2016-05-20: qty 100

## 2016-05-20 MED ORDER — HYDROMORPHONE HCL 1 MG/ML IJ SOLN
0.2500 mg | INTRAMUSCULAR | Status: DC | PRN
  Administered 2016-05-20 (×4): 0.5 mg via INTRAVENOUS

## 2016-05-20 MED ORDER — FENTANYL CITRATE (PF) 100 MCG/2ML IJ SOLN
INTRAMUSCULAR | Status: DC | PRN
  Administered 2016-05-20 (×4): 50 ug via INTRAVENOUS

## 2016-05-20 MED ORDER — PROPOFOL 10 MG/ML IV BOLUS
INTRAVENOUS | Status: AC
  Filled 2016-05-20: qty 20

## 2016-05-20 MED ORDER — SODIUM CHLORIDE 0.9 % IV SOLN
INTRAVENOUS | Status: DC
  Administered 2016-05-20: 01:00:00 via INTRAVENOUS

## 2016-05-20 MED ORDER — CHLORHEXIDINE GLUCONATE 4 % EX LIQD
60.0000 mL | Freq: Once | CUTANEOUS | Status: AC
  Administered 2016-05-20: 4 via TOPICAL

## 2016-05-20 MED ORDER — MIDAZOLAM HCL 5 MG/5ML IJ SOLN
INTRAMUSCULAR | Status: DC | PRN
  Administered 2016-05-20: 2 mg via INTRAVENOUS

## 2016-05-20 MED ORDER — METHOCARBAMOL 500 MG PO TABS
500.0000 mg | ORAL_TABLET | Freq: Four times a day (QID) | ORAL | Status: DC | PRN
  Administered 2016-05-20 – 2016-05-21 (×5): 500 mg via ORAL
  Filled 2016-05-20 (×5): qty 1

## 2016-05-20 MED ORDER — OXYCODONE HCL 5 MG PO TABS
5.0000 mg | ORAL_TABLET | ORAL | Status: DC | PRN
  Administered 2016-05-20 – 2016-05-21 (×7): 10 mg via ORAL
  Filled 2016-05-20 (×7): qty 2

## 2016-05-20 MED ORDER — HYDROMORPHONE HCL 1 MG/ML IJ SOLN
1.0000 mg | INTRAMUSCULAR | Status: DC | PRN
  Administered 2016-05-20 – 2016-05-21 (×5): 1 mg via INTRAVENOUS
  Filled 2016-05-20 (×7): qty 1

## 2016-05-20 MED ORDER — PROMETHAZINE HCL 25 MG/ML IJ SOLN: 6.2500 mg | INTRAMUSCULAR | Status: DC | PRN

## 2016-05-20 MED ORDER — LIDOCAINE 2% (20 MG/ML) 5 ML SYRINGE
INTRAMUSCULAR | Status: AC
  Filled 2016-05-20: qty 5

## 2016-05-20 MED ORDER — METOCLOPRAMIDE HCL 5 MG PO TABS: 5.0000 mg | ORAL_TABLET | Freq: Three times a day (TID) | ORAL | Status: DC | PRN

## 2016-05-20 MED ORDER — ONDANSETRON HCL 4 MG/2ML IJ SOLN
INTRAMUSCULAR | Status: AC
  Filled 2016-05-20: qty 2

## 2016-05-20 MED ORDER — ACETAMINOPHEN 325 MG PO TABS
650.0000 mg | ORAL_TABLET | Freq: Four times a day (QID) | ORAL | Status: DC | PRN
  Administered 2016-05-20: 650 mg via ORAL

## 2016-05-20 MED ORDER — METOCLOPRAMIDE HCL 5 MG/ML IJ SOLN: 5.0000 mg | Freq: Three times a day (TID) | INTRAMUSCULAR | Status: DC | PRN

## 2016-05-20 MED ORDER — METHOCARBAMOL 1000 MG/10ML IJ SOLN: 500.0000 mg | Freq: Four times a day (QID) | INTRAVENOUS | Status: DC | PRN

## 2016-05-20 MED ORDER — 0.9 % SODIUM CHLORIDE (POUR BTL) OPTIME
TOPICAL | Status: DC | PRN
  Administered 2016-05-20: 1000 mL

## 2016-05-20 SURGICAL SUPPLY — 68 items
BANDAGE ACE 4X5 VEL STRL LF (GAUZE/BANDAGES/DRESSINGS) ×3 IMPLANT
BENZOIN TINCTURE PRP APPL 2/3 (GAUZE/BANDAGES/DRESSINGS) ×3 IMPLANT
BIT DRILL 2.7XCANN QCK CNCT (BIT) ×1 IMPLANT
BIT DRILL CANN 2.7 (BIT) ×1
BIT DRILL CANN 2.7MM (BIT) ×1
BIT DRILL Q-C 2.0 DIA 100 (BIT) ×2 IMPLANT
BIT DRILL Q-C 2.0MM DIA 100MM (BIT) ×1
BIT DRILL STD 2.0MM (DRILL) ×1 IMPLANT
BIT DRL 2.7XCANN QCK CNCT (BIT) ×1
BLADE SURG ROTATE 9660 (MISCELLANEOUS) IMPLANT
BNDG CONFORM 3 STRL LF (GAUZE/BANDAGES/DRESSINGS) IMPLANT
BNDG ELASTIC 2X5.8 VLCR STR LF (GAUZE/BANDAGES/DRESSINGS) IMPLANT
CLOSURE WOUND 1/2 X4 (GAUZE/BANDAGES/DRESSINGS) ×1
CORDS BIPOLAR (ELECTRODE) ×3 IMPLANT
COVER SURGICAL LIGHT HANDLE (MISCELLANEOUS) ×3 IMPLANT
CUFF TOURNIQUET SINGLE 18IN (TOURNIQUET CUFF) IMPLANT
CUFF TOURNIQUET SINGLE 24IN (TOURNIQUET CUFF) ×3 IMPLANT
DRAPE OEC MINIVIEW 54X84 (DRAPES) ×3 IMPLANT
DRILL STANDARD 2.0MM (DRILL) ×3
DRSG EMULSION OIL 3X3 NADH (GAUZE/BANDAGES/DRESSINGS) IMPLANT
DRSG PAD ABDOMINAL 8X10 ST (GAUZE/BANDAGES/DRESSINGS) ×3 IMPLANT
DURAPREP 26ML APPLICATOR (WOUND CARE) ×3 IMPLANT
ELECT REM PT RETURN 9FT ADLT (ELECTROSURGICAL) ×3
ELECTRODE REM PT RTRN 9FT ADLT (ELECTROSURGICAL) ×1 IMPLANT
GAUZE SPONGE 4X4 12PLY STRL (GAUZE/BANDAGES/DRESSINGS) IMPLANT
GAUZE XEROFORM 1X8 LF (GAUZE/BANDAGES/DRESSINGS) IMPLANT
GAUZE XEROFORM 5X9 LF (GAUZE/BANDAGES/DRESSINGS) ×3 IMPLANT
GLOVE BIOGEL PI IND STRL 8 (GLOVE) ×2 IMPLANT
GLOVE BIOGEL PI INDICATOR 8 (GLOVE) ×4
GLOVE ORTHO TXT STRL SZ7.5 (GLOVE) ×6 IMPLANT
GOWN STRL REUS W/ TWL LRG LVL3 (GOWN DISPOSABLE) ×1 IMPLANT
GOWN STRL REUS W/ TWL XL LVL3 (GOWN DISPOSABLE) ×1 IMPLANT
GOWN STRL REUS W/TWL 2XL LVL3 (GOWN DISPOSABLE) ×3 IMPLANT
GOWN STRL REUS W/TWL LRG LVL3 (GOWN DISPOSABLE) ×2
GOWN STRL REUS W/TWL XL LVL3 (GOWN DISPOSABLE) ×2
GUIDEWIRE PIN ORTH 6X1.6XSMTH (WIRE) ×1 IMPLANT
K-WIRE 1.6 (WIRE) ×2
KIT BASIN OR (CUSTOM PROCEDURE TRAY) ×3 IMPLANT
KIT ROOM TURNOVER OR (KITS) ×3 IMPLANT
MANIFOLD NEPTUNE II (INSTRUMENTS) IMPLANT
NS IRRIG 1000ML POUR BTL (IV SOLUTION) ×3 IMPLANT
PACK ORTHO EXTREMITY (CUSTOM PROCEDURE TRAY) ×3 IMPLANT
PAD ARMBOARD 7.5X6 YLW CONV (MISCELLANEOUS) ×6 IMPLANT
PADDING CAST ABS 4INX4YD NS (CAST SUPPLIES) ×2
PADDING CAST ABS COTTON 4X4 ST (CAST SUPPLIES) ×1 IMPLANT
PLATE TUBULAR 5 HOLE (Plate) ×3 IMPLANT
SCREW CANN PT 3.5X32MM (Screw) ×2 IMPLANT
SCREW CORTICAL 2.7X14MM (Screw) ×3 IMPLANT
SCREW LOCK 14X2.7X NS (Screw) ×2 IMPLANT
SCREW LOCK 16X2.7X (Screw) ×1 IMPLANT
SCREW LOCKING 2.7X14 (Screw) ×4 IMPLANT
SCREW LOCKING 2.7X16 (Screw) ×2 IMPLANT
SCREW NLOCK CORT 2.7X8 NS (Screw) ×1 IMPLANT
SCREW NON LOCKING 2.7X8MM (Screw) ×1 IMPLANT
SPECIMEN JAR SMALL (MISCELLANEOUS) IMPLANT
SPONGE GAUZE 4X4 12PLY STER LF (GAUZE/BANDAGES/DRESSINGS) ×3 IMPLANT
STRIP CLOSURE SKIN 1/2X4 (GAUZE/BANDAGES/DRESSINGS) ×2 IMPLANT
SUCTION FRAZIER HANDLE 10FR (MISCELLANEOUS)
SUCTION TUBE FRAZIER 10FR DISP (MISCELLANEOUS) IMPLANT
SUT ETHILON 3 0 PS 1 (SUTURE) ×3 IMPLANT
SUT ETHILON 4 0 P 3 18 (SUTURE) IMPLANT
SUT ETHILON 5 0 P 3 18 (SUTURE)
SUT NYLON ETHILON 5-0 P-3 1X18 (SUTURE) IMPLANT
TOWEL OR 17X24 6PK STRL BLUE (TOWEL DISPOSABLE) ×3 IMPLANT
TOWEL OR 17X26 10 PK STRL BLUE (TOWEL DISPOSABLE) ×3 IMPLANT
TUBE CONNECTING 12'X1/4 (SUCTIONS)
TUBE CONNECTING 12X1/4 (SUCTIONS) IMPLANT
WATER STERILE IRR 1000ML POUR (IV SOLUTION) IMPLANT

## 2016-05-20 NOTE — Interval H&P Note (Signed)
History and Physical Interval Note:  05/20/2016 7:38 AM  Ferdinand LangoBryan L Szostak  has presented today for surgery, with the diagnosis of Right 2nd Metatarsal Fracture, Lisfranc Fracture  The various methods of treatment have been discussed with the patient and family. After consideration of risks, benefits and other options for treatment, the patient has consented to  Procedure(s): OPEN REDUCTION INTERNAL FIXATION (ORIF) RIGHT 2ND  METATARSAL (TOE) FRACTURE, OPEN REDUCTION INTERNAL FIXATION LISFRANC JOINT (Right) as a surgical intervention .  The patient's history has been reviewed, patient examined, no change in status, stable for surgery.  I have reviewed the patient's chart and labs.  Questions were answered to the patient's satisfaction.     Eldred MangesMark C Makylah Bossard

## 2016-05-20 NOTE — Progress Notes (Signed)
Patient ID: Christian Roberson, male   DOB: Apr 17, 1981, 35 y.o.   MRN: [id number] Discussed planned procedure, reviewed plain xrays and CT with pt at bedside and discussed surgical plan.  He had old injury of 1st MT playing soccer in college which healed. He has dorsal spurring at 1st MTP joint. Surgery today, risks of infection, swelling problems, dorsal skin checked this AM by me, post op elevation and immobilization discussed. ?'s answered , he request we proceed. Will be NWB post op

## 2016-05-20 NOTE — Anesthesia Procedure Notes (Signed)
Procedure Name: LMA Insertion Date/Time: 05/20/2016 3:19 PM Performed by: Roney MansSMITH, Nasteho Glantz P Pre-anesthesia Checklist: Patient identified, Emergency Drugs available, Suction available, Patient being monitored and Timeout performed Patient Re-evaluated:Patient Re-evaluated prior to inductionOxygen Delivery Method: Circle system utilized Preoxygenation: Pre-oxygenation with 100% oxygen Intubation Type: IV induction Ventilation: Mask ventilation without difficulty LMA: LMA inserted LMA Size: 5.0 Number of attempts: 1 Placement Confirmation: positive ETCO2 and breath sounds checked- equal and bilateral Tube secured with: Tape Dental Injury: Teeth and Oropharynx as per pre-operative assessment

## 2016-05-20 NOTE — Anesthesia Preprocedure Evaluation (Signed)
Anesthesia Evaluation  Patient identified by MRN, date of birth, ID band Patient awake    Reviewed: Allergy & Precautions, NPO status , Patient's Chart, lab work & pertinent test results  Airway Mallampati: I  TM Distance: >3 FB Neck ROM: Full    Dental no notable dental hx.    Pulmonary neg pulmonary ROS, Current Smoker,    Pulmonary exam normal        Cardiovascular negative cardio ROS Normal cardiovascular exam     Neuro/Psych negative neurological ROS     GI/Hepatic negative GI ROS, Neg liver ROS,   Endo/Other  negative endocrine ROS  Renal/GU negative Renal ROS     Musculoskeletal negative musculoskeletal ROS (+)   Abdominal   Peds  Hematology  (+) Blood dyscrasia (chronic idiopathic thrombocytopenia, plt 51k), ,   Anesthesia Other Findings   Reproductive/Obstetrics                             Anesthesia Physical Anesthesia Plan  ASA: II  Anesthesia Plan: General   Post-op Pain Management:    Induction: Intravenous  Airway Management Planned: LMA  Additional Equipment:   Intra-op Plan:   Post-operative Plan: Extubation in OR  Informed Consent: I have reviewed the patients History and Physical, chart, labs and discussed the procedure including the risks, benefits and alternatives for the proposed anesthesia with the patient or authorized representative who has indicated his/her understanding and acceptance.   Dental advisory given  Plan Discussed with:   Anesthesia Plan Comments:         Anesthesia Quick Evaluation Patient offered nerve block, but refused

## 2016-05-20 NOTE — Transfer of Care (Signed)
Immediate Anesthesia Transfer of Care Note  Patient: Christian Roberson  Procedure(s) Performed: Procedure(s): OPEN REDUCTION INTERNAL FIXATION (ORIF) RIGHT 2ND  METATARSAL (TOE) FRACTURE, OPEN REDUCTION INTERNAL FIXATION LISFRANC JOINT (Right)  Patient Location: PACU  Anesthesia Type:General  Level of Consciousness: awake, alert , oriented and patient cooperative  Airway & Oxygen Therapy: Patient Spontanous Breathing and Patient connected to nasal cannula oxygen  Post-op Assessment: Report given to RN and Post -op Vital signs reviewed and stable  Post vital signs: Reviewed and stable  Last Vitals:  Vitals:   05/19/16 1949 05/20/16 0420  BP: (!) 133/95 (!) 140/93  Pulse: 96 74  Resp: 18 18  Temp: 36.6 C 36.7 C    Last Pain:  Vitals:   05/20/16 1230  TempSrc:   PainSc: 3       Patients Stated Pain Goal: 3 (05/18/16 1946)  Complications: No apparent anesthesia complications

## 2016-05-21 ENCOUNTER — Encounter (HOSPITAL_COMMUNITY): Payer: Self-pay | Admitting: *Deleted

## 2016-05-21 NOTE — Progress Notes (Signed)
Subjective: 1 Day Post-Op Procedure(s) (LRB): OPEN REDUCTION INTERNAL FIXATION (ORIF) RIGHT 2ND  METATARSAL (TOE) FRACTURE, OPEN REDUCTION INTERNAL FIXATION LISFRANC JOINT (Right) Patient reports pain as mild and moderate.    Objective: Vital signs in last 24 hours: Temp:  [97 F (36.1 C)-98.6 F (37 C)] 98.6 F (37 C) (09/28 0454) Pulse Rate:  [57-100] 100 (09/28 0454) Resp:  [6-14] 13 (09/27 1745) BP: (130-151)/(78-101) 136/85 (09/28 0454) SpO2:  [93 %-100 %] 97 % (09/28 0454)  Intake/Output from previous day: 09/27 0701 - 09/28 0700 In: 2543.8 [I.V.:2443.8; IV Piggyback:100] Out: 30 [Blood:30] Intake/Output this shift: No intake/output data recorded.   Recent Labs  05/18/16 1415  HGB 14.8    Recent Labs  05/18/16 1415  WBC 7.3  RBC 4.11*  HCT 43.2  PLT 51*    Recent Labs  05/18/16 1415  NA 137  K 3.9  CL 103  CO2 24  BUN 13  CREATININE 1.02  GLUCOSE 86  CALCIUM 9.1    Recent Labs  05/18/16 1415  INR 1.01    Neurologically intact  Assessment/Plan: 1 Day Post-Op Procedure(s) (LRB): OPEN REDUCTION INTERNAL FIXATION (ORIF) RIGHT 2ND  METATARSAL (TOE) FRACTURE, OPEN REDUCTION INTERNAL FIXATION LISFRANC JOINT (Right) Up with therapy  SL IV  Eldred MangesMark C Octavie Westerhold 05/21/2016, 8:10 AM

## 2016-05-21 NOTE — Evaluation (Signed)
Physical Therapy Evaluation and Discharge Patient Details Name: Christian Roberson MRN: [id number] DOB: [birth year] Today's Date: 05/21/2016   History of Present Illness  s/p forklift rolled over his right foot. With 2nd MT Fx and widening of 2nd 3rd MT space,  Clinical Impression  Patient evaluated by Physical Therapy with no further acute PT needs identified. All education has been completed and the patient has no further questions. Patient is very agile and has prior experience with using crutches. At this time his mobility is limited by his pain. Discussed working on his pain control with his Charity fundraiserN. See below for any follow-up Physical Therapy or equipment needs. PT is signing off. Thank you for this referral.     Follow Up Recommendations No PT follow up    Equipment Recommendations  Rolling walker with 5" wheels    Recommendations for Other Services       Precautions / Restrictions Precautions Precautions: None Restrictions RLE Weight Bearing: Non weight bearing      Mobility  Bed Mobility Overal bed mobility: Modified Independent             General bed mobility comments: incr time due to pain  Transfers Overall transfer level: Needs assistance Equipment used: Crutches Transfers: Sit to/from Stand Sit to Stand: Min guard;Modified independent (Device/Increase time) (progressed with education/practice)         General transfer comment: sit to stand x 3 for crutch adjustment/training; excellent balance; pain severe and he propped RLE up on mattress between trials due to pain  Ambulation/Gait Ambulation/Gait assistance: Min guard Ambulation Distance (Feet): 2 Feet Assistive device: Crutches Gait Pattern/deviations: Step-to pattern     General Gait Details: pt reports walking to bathroom several times with nursing with RW; wanted to attempt crutches, however did not feel secure using them and quickly decided to use RW (and knee scooter from work); educated patient on  proper use of rolling walker (including turning sideways for tight spaces)  Stairs Stairs:  (pt deferred due to pain; visual demonstration in room 1 step)          Wheelchair Mobility    Modified Rankin (Stroke Patients Only)       Balance Overall balance assessment: Independent                                           Pertinent Vitals/Pain Pain Assessment: 0-10 Pain Score: 10-Worst pain ever Pain Location: Rt foot  Pain Descriptors / Indicators: Throbbing;Sharp Pain Intervention(s): Limited activity within patient's tolerance;Premedicated before session;Repositioned    Home Living Family/patient expects to be discharged to:: Private residence Living Arrangements: Non-relatives/Friends Available Help at Discharge: Friend(s);Available PRN/intermittently Type of Home: House Home Access: Stairs to enter   Entrance Stairs-Number of Steps: 1 Home Layout: Able to live on main level with bedroom/bathroom Home Equipment: Other (comment) (can borrow knee scooter from work)      Prior Function Level of Independence: Independent         Comments: sells large trailers; was assisting with delivery to customer     Hand Dominance        Extremity/Trunk Assessment   Upper Extremity Assessment: Overall WFL for tasks assessed           Lower Extremity Assessment: Overall WFL for tasks assessed;RLE deficits/detail RLE Deficits / Details: in short leg splint with ace wrap    Cervical / Trunk  Assessment: Normal  Communication   Communication: No difficulties  Cognition Arousal/Alertness: Awake/alert Behavior During Therapy: Restless Overall Cognitive Status: Within Functional Limits for tasks assessed                      General Comments General comments (skin integrity, edema, etc.): reiterated importance of elevation and as foot lowered for mobility, it is going to be painful    Exercises     Assessment/Plan    PT Assessment  Patent does not need any further PT services  PT Problem List            PT Treatment Interventions      PT Goals (Current goals can be found in the Care Plan section)  Acute Rehab PT Goals Patient Stated Goal: control pain to tolerate mobility PT Goal Formulation: All assessment and education complete, DC therapy    Frequency     Barriers to discharge        Co-evaluation               End of Session Equipment Utilized During Treatment: Gait belt Activity Tolerance: Patient limited by pain Patient left: in bed;with call bell/phone within reach Nurse Communication: Mobility status;Other (comment) (limited by pain; moves very well; no further PT needs)         Time: 1610-9604 PT Time Calculation (min) (ACUTE ONLY): 30 min   Charges:   PT Evaluation $PT Eval Low Complexity: 1 Procedure PT Treatments $Gait Training: 8-22 mins   PT G Codes:        Wood Novacek 06/09/16, 10:37 AM Pager [phone]

## 2016-05-21 NOTE — Anesthesia Postprocedure Evaluation (Signed)
Anesthesia Post Note  Patient: Ferdinand LangoBryan L Hollinshead  Procedure(s) Performed: Procedure(s) (LRB): OPEN REDUCTION INTERNAL FIXATION (ORIF) RIGHT 2ND  METATARSAL (TOE) FRACTURE, OPEN REDUCTION INTERNAL FIXATION LISFRANC JOINT (Right)  Patient location during evaluation: PACU Anesthesia Type: General Level of consciousness: awake and alert Pain management: pain level controlled Vital Signs Assessment: post-procedure vital signs reviewed and stable Respiratory status: spontaneous breathing, nonlabored ventilation, respiratory function stable and patient connected to nasal cannula oxygen Cardiovascular status: blood pressure returned to baseline and stable Postop Assessment: no signs of nausea or vomiting Anesthetic complications: no    Last Vitals:  Vitals:   05/21/16 0049 05/21/16 0454  BP: 130/88 136/85  Pulse: 85 100  Resp:    Temp: 36.9 C 37 C    Last Pain:  Vitals:   05/21/16 0853  TempSrc:   PainSc: 8                  Jaquane Boughner Astrid DivineS Breean Nannini

## 2016-05-21 NOTE — Care Management Note (Signed)
Case Management Note  Patient Details  Name: Christian Roberson MRN: [id number] Date of Birth: 1981/05/30  Subjective/Objective:    35 yr old gentleman s/p right foot crush injury, underwent ORIF right foot.                Action/Plan: Case manager spoke with patient concerning DME needs.He has no Home Health needs.  He is under worker's comp. CM contacted his adjusterDrenda Freeze- [name] [phone] ext [phone]. Instructed to call [facility] care management at [phone]. Patient's referral number is [id number]. CM faxed orders to [facility] at [phone].  They will arrange delivery of rolling walker. CM did explain that we have [facility] in the buildiing, provided the office number.    Expected Discharge Date:    05/22/16               Expected Discharge Plan:  Home/Self Care  In-House Referral:     Discharge planning Services  CM Consult  Post Acute Care Choice:  Durable Medical Equipment Choice offered to:   (patient under worker's comp)  DME Arranged:    DME Agency:     HH Arranged:  NA HH Agency:     Status of Service:  In process, will continue to follow  If discussed at Long Length of Stay Meetings, dates discussed:    Additional Comments:  [name], [name] [name], RN 05/21/2016, 2:38 PM

## 2016-05-21 NOTE — Progress Notes (Signed)
OT Cancellation/Discharge Note  Patient Details Name: Christian LangoBryan L Zelek MRN: [id number] DOB: 10-27-1980   Cancelled Treatment:    Reason Eval/Treat Not Completed: OT screened, no needs identified, will sign off; Pt is able bodied and has good mobility according to PT. Discussed safety during ADL and he has grabber/reacher as well as long handle sponge for bathing. Pt was fully dressed when OT entered room, and Per Pt his girlfriend handed him the clothes and he dressed independently. No OT equipment needs. OT signing off. Thank you for this referral.  Emelda FearLaura J Jadrien Narine 05/21/2016, 2:10 PM  Sherryl MangesLaura Jarvis Sawa OTR/L [phone]

## 2016-05-21 NOTE — Progress Notes (Signed)
Subjective: 1 Day Post-Op Procedure(s) (LRB): OPEN REDUCTION INTERNAL FIXATION (ORIF) RIGHT 2ND  METATARSAL (TOE) FRACTURE, OPEN REDUCTION INTERNAL FIXATION LISFRANC JOINT (Right) Patient reports pain as 7 on 0-10 scale.    Objective: Vital signs in last 24 hours: Temp:  [97 F (36.1 C)-98.6 F (37 C)] 98.6 F (37 C) (09/28 0454) Pulse Rate:  [57-100] 100 (09/28 0454) Resp:  [6-14] 13 (09/27 1745) BP: (130-151)/(78-101) 136/85 (09/28 0454) SpO2:  [93 %-100 %] 97 % (09/28 0454)  Intake/Output from previous day: 09/27 0701 - 09/28 0700 In: 2543.8 [I.V.:2443.8; IV Piggyback:100] Out: 30 [Blood:30] Intake/Output this shift: No intake/output data recorded.   Recent Labs  05/18/16 1415  HGB 14.8    Recent Labs  05/18/16 1415  WBC 7.3  RBC 4.11*  HCT 43.2  PLT 51*    Recent Labs  05/18/16 1415  NA 137  K 3.9  CL 103  CO2 24  BUN 13  CREATININE 1.02  GLUCOSE 86  CALCIUM 9.1    Recent Labs  05/18/16 1415  INR 1.01    Neurologically intact  Assessment/Plan: 1 Day Post-Op Procedure(s) (LRB): OPEN REDUCTION INTERNAL FIXATION (ORIF) RIGHT 2ND  METATARSAL (TOE) FRACTURE, OPEN REDUCTION INTERNAL FIXATION LISFRANC JOINT (Right) Plan:  NWB left LE for 6 wks.     Knee immobilizer removed.   Christian MangesMark C Katrisha Roberson 05/21/2016, 8:05 AM

## 2016-05-21 NOTE — Op Note (Signed)
NAMMarland Kitchen:  Christian Roberson, [name]                 ACCOUNT NO.:  [id number]  MEDICAL RECORD NO.:  [id number]  LOCATION:  5N30C                        FACILITY:  [facility]  PHYSICIAN:  [name], M.D.    DATE OF BIRTH:  1981/06/24  DATE OF PROCEDURE: DATE OF DISCHARGE:                              OPERATIVE REPORT   PREOPERATIVE DIAGNOSIS:  Right foot crush injury, displaced second metatarsal shaft fracture and tarsometatarsal injury, 3rd and 4th tarsometatarsal joints.  POSTOPERATIVE DIAGNOSIS:  Right foot crush injury, displaced second metatarsal shaft fracture and tarsometatarsal injury, 3rd and 4th tarsometatarsal joints.  PROCEDURE:  Open reduction and internal fixation of right second metatarsal shaft fracture, open reduction and internal fixation, Lisfranc joint, 3rd and 4th tarsometatarsal joint.  SURGEON:  [name], M.D.  ASSISTANT:  Genene ChurnJames M. Barry Dieneswens, PA-C, medically necessary and present for the entire procedure.  ANESTHESIA:  General.  COMPLICATIONS:  None.  DESCRIPTION OF PROCEDURE:  After induction of general anesthesia and orotracheal intubation, proximal thigh tourniquet, prepping with DuraPrep.  The patient had some abrasions over the first metatarsal.  CT scan showed second metatarsal displaced fracture gap between the 2nd and 3rd and fractures of the proximal aspect of the 3rd and 4th metatarsals with slight subluxation involving the tarsometatarsal joint.  The Lisfranc ligament appeared normal.  After time-out, sterile skin marker, a longitudinal incision was made.  Over the second metatarsal, there was considerable swelling, blunt dissection only down to the metatarsals performed.  Hohmann retractors placed.  Proximal and distal ends were found.  There were a shaft and half with displacement.  Fracture was reduced.  A Biomet stainless steel mini-frag plate was applied and 2 screws above and below with 2 locking screws distal reducing the fracture.  This helped  reduce the width between the 2nd and 3rd, 3rd was dorsally subluxed of the tarsometatarsal joint, it was pushed down and lag screw was drilled across.  Cannulated screw 3.5, between the 3rd tarsometatarsal joint checked under fluoro.  This was a partially- threaded lag screw, secured it, 4th was stable and was in good position once the 3rd screw was tightened down securely.  AP, lateral, and oblique spot pictures were taken documenting the reduction.  Irrigation with saline solution.  Tourniquet deflation.  Skin closure with 2-0 nylon.  The interossei were allowed to fall back into position.  There was slight bleeding over the branch off the dorsalis pedis.  A couple transverse veins had to be coagulated with bipolar on the way in.  Xeroform, 4x4s, Webril, ABDs, and Ace wrap were applied.  The patient will be in a CAM boot postoperatively.  He tolerated the procedure well, transferred to recovery room.     [name], M.D.     MCY/MEDQ  D:  05/20/2016  T:  05/21/2016  Job:  [id number]

## 2016-05-22 ENCOUNTER — Encounter (HOSPITAL_COMMUNITY): Payer: Self-pay | Admitting: Orthopaedic Surgery

## 2016-05-22 MED ORDER — OXYCODONE-ACETAMINOPHEN 5-325 MG PO TABS: 1.0000 | ORAL_TABLET | ORAL | 0 refills | Status: DC | PRN

## 2016-05-22 MED ORDER — ASPIRIN 325 MG PO TABS: 325.0000 mg | ORAL_TABLET | Freq: Every day | ORAL | Status: DC

## 2016-05-22 NOTE — Progress Notes (Signed)
Subjective: 2 Days Post-Op Procedure(s) (LRB): OPEN REDUCTION INTERNAL FIXATION (ORIF) RIGHT 2ND  METATARSAL (TOE) FRACTURE, OPEN REDUCTION INTERNAL FIXATION LISFRANC JOINT (Right) Patient reports pain as mild and moderate.    Objective: Vital signs in last 24 hours: Temp:  [98.4 F (36.9 C)-99.6 F (37.6 C)] 98.4 F (36.9 C) (09/29 0604) Pulse Rate:  [61-87] 61 (09/29 0604) Resp:  [16] 16 (09/29 0604) BP: (120-139)/(74-89) 120/74 (09/29 0604) SpO2:  [99 %] 99 % (09/29 0604)  Intake/Output from previous day: 09/28 0701 - 09/29 0700 In: 720 [P.O.:720] Out: -  Intake/Output this shift: No intake/output data recorded.  No results for input(s): HGB in the last 72 hours. No results for input(s): WBC, RBC, HCT, PLT in the last 72 hours. No results for input(s): NA, K, CL, CO2, BUN, CREATININE, GLUCOSE, CALCIUM in the last 72 hours. No results for input(s): LABPT, INR in the last 72 hours.  Neurologically intact  Assessment/Plan: 2 Days Post-Op Procedure(s) (LRB): OPEN REDUCTION INTERNAL FIXATION (ORIF) RIGHT 2ND  METATARSAL (TOE) FRACTURE, OPEN REDUCTION INTERNAL FIXATION LISFRANC JOINT (Right) Discharge Home. Rx percocet and ASA  Eldred MangesMark C Mayleen Borrero 05/22/2016, 8:35 AM

## 2016-05-22 NOTE — Care Management Note (Signed)
Case Management Note  Patient Details  Name: Christian Roberson MRN: [id number] Date of Birth: [birth year]  Subjective/Objective:     S/p right foot crush injury, ORIF right foot                Action/Plan: Discharge Planning: AVS reviewed:  Contacted Drenda Freezeurtis Carpenter and [facility] Contacted [facility] and provided adjuster contact name. [facility] delivered RW to pt's room prior to dc. Received call from [facility] #[phone] ext [phone], spoke to [place]. Wanted to clarify if pt was requesting RW with seat. Explained pt had dc and they will follow up with pt and pt's MD about any additional DME for home.   Expected Discharge Date:                  Expected Discharge Plan:  Home/Self Care  In-House Referral:  NA  Discharge planning Services  CM Consult  Post Acute Care Choice:  Durable Medical Equipment Choice offered to:   (patient under worker's comp)  DME Arranged:  Dan HumphreysWalker rolling DME Agency:  [facility]  HH Arranged:  NA HH Agency:  NA  Status of Service:  Completed, signed off  If discussed at Long Length of Stay Meetings, dates discussed:    Additional Comments:  Elliot CousinShavis, Daleiza Bacchi Ellen, RN 05/22/2016, 12:24 PM

## 2016-05-22 NOTE — Progress Notes (Addendum)
Reviewed discharge papers and medications with full understanding, also went over 2 wheel walker with [name]

## 2016-06-09 ENCOUNTER — Inpatient Hospital Stay (INDEPENDENT_AMBULATORY_CARE_PROVIDER_SITE_OTHER): Payer: Self-pay | Admitting: Orthopaedic Surgery

## 2016-06-09 ENCOUNTER — Inpatient Hospital Stay (INDEPENDENT_AMBULATORY_CARE_PROVIDER_SITE_OTHER): Payer: Worker's Compensation | Admitting: Orthopaedic Surgery

## 2016-06-09 DIAGNOSIS — S92321A Displaced fracture of second metatarsal bone, right foot, initial encounter for closed fracture: Secondary | ICD-10-CM

## 2016-06-09 DIAGNOSIS — S93324A Dislocation of tarsometatarsal joint of right foot, initial encounter: Secondary | ICD-10-CM

## 2016-06-11 NOTE — Discharge Summary (Signed)
Patient ID: Christian Roberson MRN: [id number] DOB/AGE: 1981-01-30 35 y.o.  Admit date: 05/18/2016 Discharge date: 06/11/2016  Admission Diagnoses:  Active Problems:   Crush injury of right foot   Discharge Diagnoses:  Active Problems:   Crush injury of right foot  status post Procedure(s): OPEN REDUCTION INTERNAL FIXATION (ORIF) RIGHT 2ND  METATARSAL (TOE) FRACTURE, OPEN REDUCTION INTERNAL FIXATION LISFRANC JOINT  Past Medical History:  Diagnosis Date  . Chronic idiopathic thrombocytopenia (HCC)     Surgeries: Procedure(s): OPEN REDUCTION INTERNAL FIXATION (ORIF) RIGHT 2ND  METATARSAL (TOE) FRACTURE, OPEN REDUCTION INTERNAL FIXATION LISFRANC JOINT on 05/18/2016 - 05/20/2016   Consultants:   Discharged Condition: Improved  Hospital Course: Christian Roberson is an 35 y.o. male who was admitted 05/18/2016 for operative treatment of foot fracture/dislocation. Patient failed conservative treatments (please see the history and physical for the specifics) and had severe unremitting pain that affects sleep, daily activities and work/hobbies. After pre-op clearance, the patient was taken to the operating room on 05/18/2016 - 05/20/2016 and underwent  Procedure(s): OPEN REDUCTION INTERNAL FIXATION (ORIF) RIGHT 2ND  METATARSAL (TOE) FRACTURE, OPEN REDUCTION INTERNAL FIXATION LISFRANC JOINT.    Patient was given perioperative antibiotics:  Anti-infectives    Start     Dose/Rate Route Frequency Ordered Stop   05/20/16 2200  ceFAZolin (ANCEF) IVPB 1 g/50 mL premix     1 g 100 mL/hr over 30 Minutes Intravenous Every 8 hours 05/20/16 1807 05/21/16 1352   05/20/16 1515  ceFAZolin (ANCEF) IVPB 2g/100 mL premix     2 g 200 mL/hr over 30 Minutes Intravenous To Short Stay 05/20/16 0058 05/20/16 1521       Patient was given sequential compression devices and early ambulation to prevent DVT.   Patient benefited maximally from hospital stay and there were no complications. At the time of discharge, the  patient was urinating/moving their bowels without difficulty, tolerating a regular diet, pain is controlled with oral pain medications and they have been cleared by PT/OT.   Recent vital signs: No data found.    Recent laboratory studies: No results for input(s): WBC, HGB, HCT, PLT, NA, K, CL, CO2, BUN, CREATININE, GLUCOSE, INR, CALCIUM in the last 72 hours.  Invalid input(s): PT, 2   Discharge Medications:     Medication List    STOP taking these medications   amoxicillin-clavulanate 875-125 MG tablet Commonly known as:  AUGMENTIN     TAKE these medications   aspirin 325 MG tablet Commonly known as:  BAYER ASPIRIN Take 1 tablet (325 mg total) by mouth daily.   ibuprofen 200 MG tablet Commonly known as:  MOTRIN IB Take 3 tablets (600 mg total) by mouth every 6 (six) hours as needed.   oxyCODONE-acetaminophen 5-325 MG tablet Commonly known as:  ROXICET Take 1-2 tablets by mouth every 4 (four) hours as needed for severe pain. What changed:  how much to take  when to take this       Diagnostic Studies: Dg Chest 2 View  Result Date: 05/18/2016 CLINICAL DATA:  Crush injury of the right foot. Preoperative examination. Buttocks EXAM: CHEST  2 VIEW COMPARISON:  Chest x-ray of April 06, 2004 FINDINGS: The lungs are well-expanded. There is no focal infiltrate. There is no pleural effusion. The heart and pulmonary vascularity are normal. The mediastinum is normal in width. There is curvature of the lower thoracic spine convex toward the right which is stable. IMPRESSION: There is no acute cardiopulmonary abnormality. Electronically Signed  By: David  [place] M.D.   On: 05/18/2016 14:46   Dg Tibia/fibula Right  Result Date: 05/18/2016 CLINICAL DATA:  Pain after being run over by a forklift. EXAM: RIGHT TIBIA AND FIBULA - 2 VIEW COMPARISON:  None. FINDINGS: No acute fracture or dislocation. Slight spurring at the tip of the medial malleolus. Possible small osteochondral lesion of  the medial aspect of the dome of the talus. IMPRESSION: No acute abnormalities.  Arthritic changes of the ankle joint. Electronically Signed   By: Francene Boyers M.D.   On: 05/18/2016 10:57   Ct Foot Right Wo Contrast  Result Date: 05/19/2016 CLINICAL DATA:  Right foot run over by a forklift, with known fractures. Assess for Lisfranc injury. Initial encounter. EXAM: CT OF THE RIGHT FOOT WITHOUT CONTRAST TECHNIQUE: Multidetector CT imaging of the right foot was performed according to the standard protocol. Multiplanar CT image reconstructions were also generated. COMPARISON:  Right foot radiographs performed earlier today at 10:41 a.m. FINDINGS: Bones/Joint/Cartilage There is a significantly displaced fracture of the proximal second metatarsal, with 1 shaft width lateral displacement. There are also intra-articular fractures through the medial bases of the third and fourth metatarsals, with associated widening of the space between the bases of the second and third metatarsals to 5 mm, reflecting mild focal Lisfranc injury. The space between the bases of the first and second metatarsals is preserved, and there is only minimal medial widening of the third tarsometatarsal joint. Ligaments Suboptimally assessed by CT. Muscles and Tendons The visualized musculature is grossly unremarkable in appearance. Flexor and extensor tendons are grossly unremarkable in appearance. The peroneal tendons are grossly unremarkable. The Achilles tendon is grossly unremarkable in appearance. Soft tissues Mild dorsal soft tissue swelling is noted along the right foot. IMPRESSION: 1. Significantly displaced fracture of the proximal second metatarsal, with 1 shaft width lateral displacement. 2. Intraarticular fractures involving the medial bases of the third and fourth metatarsals, with associated widening of the space between the bases of the second and third metatarsals to 5 mm, reflecting mild focal Lisfranc injury. There is only  minimal medial widening of the third tarsometatarsal joint. 3. The space between the bases of the first and second metatarsals is preserved. 4. Mild dorsal soft tissue swelling noted along the right foot. Electronically Signed   By: Roanna Raider M.D.   On: 05/19/2016 04:15   Dg Knee Complete 4 Views Right  Result Date: 05/18/2016 CLINICAL DATA:  Right knee pain after being run over by a forklift. EXAM: RIGHT KNEE - COMPLETE 4+ VIEW COMPARISON:  None. FINDINGS: No evidence of fracture, dislocation, or joint effusion. No evidence of arthropathy or other focal bone abnormality. Soft tissues are unremarkable. IMPRESSION: Negative. Electronically Signed   By: Francene Boyers M.D.   On: 05/18/2016 10:55   Dg Foot Complete Right  Result Date: 05/18/2016 CLINICAL DATA:  Right foot pain and swelling secondary to crush injury from being run over by a forklift. EXAM: RIGHT FOOT COMPLETE - 3+ VIEW COMPARISON:  None. FINDINGS: There is a displaced fracture of the proximal shaft of the second metatarsal. No other visible fractures. Dorsal spurring at the tarsal metatarsal joints and the talonavicular joint. No appreciable widening of the Lisfranc joint. There is suggestion of widening of the joint between the bases of the second and third metatarsals. IMPRESSION: Displaced transverse fracture of the shaft of the second metatarsal. Possible disruption of joint between the bases of the second and third metatarsals. Electronically Signed   By: Fayrene Fearing  Maxwell M.D.   On: 05/18/2016 10:54      Follow-up Information    Eldred Manges, MD Follow up in 2 week(s).   Specialty:  Orthopedic Surgery Contact information: [address] [place] [address] [phone]           Discharge Plan:  discharge to home  Disposition:     Signed: Naida Sleight for mark yates md 06/11/2016, 10:37 AM

## 2016-07-01 ENCOUNTER — Inpatient Hospital Stay (INDEPENDENT_AMBULATORY_CARE_PROVIDER_SITE_OTHER): Payer: Self-pay | Admitting: Orthopaedic Surgery

## 2016-07-07 ENCOUNTER — Ambulatory Visit (INDEPENDENT_AMBULATORY_CARE_PROVIDER_SITE_OTHER): Payer: Worker's Compensation | Admitting: Orthopaedic Surgery

## 2016-07-07 ENCOUNTER — Encounter (INDEPENDENT_AMBULATORY_CARE_PROVIDER_SITE_OTHER): Payer: Self-pay | Admitting: Orthopaedic Surgery

## 2016-07-07 ENCOUNTER — Ambulatory Visit (INDEPENDENT_AMBULATORY_CARE_PROVIDER_SITE_OTHER): Payer: Worker's Compensation

## 2016-07-07 VITALS — BP 133/82 | HR 78 | Ht 72.0 in | Wt 198.0 lb

## 2016-07-07 DIAGNOSIS — S93324A Dislocation of tarsometatarsal joint of right foot, initial encounter: Secondary | ICD-10-CM | POA: Insufficient documentation

## 2016-07-07 DIAGNOSIS — M79671 Pain in right foot: Secondary | ICD-10-CM

## 2016-07-07 DIAGNOSIS — S93324D Dislocation of tarsometatarsal joint of right foot, subsequent encounter: Secondary | ICD-10-CM

## 2016-07-07 NOTE — Progress Notes (Addendum)
Office Visit Note   Patient: Christian Roberson           Date of Birth: 09-Aug-1981           MRN: [id number] Visit Date: 07/07/2016              Requested by: Kerman PasseyMelinda P Lada, MD [address] [place], [place] [address] PCP: Baruch GoutyMelinda Lada, MD   Assessment & Plan: Visit Diagnoses:  1. Pain in right foot   2. Closed dislocation of tarsometatarsal joint of right foot, subsequent encounter     Plan: Patient can start weaning his way into a regular shoe starting a few hours a day. Work slip given for no work until 08/03/2016. 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 He is on his feet a lot. Continue elevation recheck 6 weeks work resumption 08/03/2016. I expect he will be at [facility] and 2-3 months. Follow-Up Instructions: Return in about 6 weeks (around 08/18/2016).   Orders:  Orders Placed This Encounter  Procedures  . XR Foot Complete Right   No orders of the defined types were placed in this encounter.     Procedures: No procedures performed   Clinical Data: No additional findings.   Subjective: Chief Complaint  Patient presents with  . Right Foot - Routine Post Op, Follow-up    Patient is here for follow up Right ORIF 2nd metatarsal fracture and ORIF Lisfranc Joint on 05/20/2016.  He states that he is feeling better. He does still continue to have some pain and numbness in his toes.  He is weightbearing in CAM boot. Continued out of work.    Review of Systems unchanged   Objective: Vital Signs: BP 133/82   Pulse 78   Ht 6' (1.829 m)   Wt 198 lb (89.8 kg)   BMI 26.85 kg/m   Physical Exam physical exam is unchanged other than enough his foot incision healing.  Ortho Exam midline dorsal incision has a small edge of the eschar present there is no drainage. He is applying lotion several times a day still has some dry skin. He'll continue to keep his foot elevated is been on his feet a  fair amount of encouraged him to elevate it as much as possible. Specialty Comments:  No specialty comments available.  Imaging: Xr Foot Complete Right  Result Date: 07/07/2016 Three-view x-rays right foot obtained. This shows interval healing the second metatarsal shaft fracture and screw fixation Lisfranc joint and also third metatarsal. Impression: Post-ORIF right LisFranc joint. Good position and alignment interval healing.    PMFS History: Patient Active Problem List   Diagnosis Date Noted  . Closed dislocation of tarsometatarsal joint of right foot 07/07/2016  . Crush injury of right foot 05/18/2016   Past Medical History:  Diagnosis Date  . Chronic idiopathic thrombocytopenia (HCC)     Family History  Problem Relation Age of Onset  . Leukemia Cousin   . Leukemia Cousin   . Leukemia Cousin   . Leukemia Maternal Uncle     Past Surgical History:  Procedure Laterality Date  . HAND SURGERY    . ORIF TOE FRACTURE Right 05/20/2016   Procedure: OPEN REDUCTION INTERNAL FIXATION (ORIF) RIGHT 2ND  METATARSAL (TOE) FRACTURE, OPEN REDUCTION INTERNAL FIXATION LISFRANC JOINT;  Surgeon: Eldred MangesMark C Yates, MD;  Location: [facility] OR;  Service: Orthopedics;  Laterality: Right;   Social History  Occupational History  . Not on file.   Social History Main Topics  . Smoking status: Current Some Day Smoker  . Smokeless tobacco: Never Used  . Alcohol use Yes  . Drug use: Unknown  . Sexual activity: Not on file

## 2016-07-09 NOTE — Progress Notes (Signed)
Per Dr. Ophelia CharterYates..WC

## 2016-07-21 ENCOUNTER — Telehealth (INDEPENDENT_AMBULATORY_CARE_PROVIDER_SITE_OTHER): Payer: Self-pay | Admitting: Orthopaedic Surgery

## 2016-07-21 NOTE — Telephone Encounter (Signed)
Please advise 

## 2016-07-21 NOTE — Telephone Encounter (Signed)
OK - thanks

## 2016-07-21 NOTE — Telephone Encounter (Signed)
Patient is requesting an extension on his work note. His foot is still swollen and painful, he still isnt able to put on a shoe. As of now his note is until 08/03/16.  He is requesting at least another week.  Cb# [phone]

## 2016-07-22 ENCOUNTER — Encounter (INDEPENDENT_AMBULATORY_CARE_PROVIDER_SITE_OTHER): Payer: Self-pay | Admitting: Radiology

## 2016-07-22 NOTE — Telephone Encounter (Signed)
Note up front for pick up. I left voicemail advising.

## 2016-08-03 ENCOUNTER — Telehealth (INDEPENDENT_AMBULATORY_CARE_PROVIDER_SITE_OTHER): Payer: Self-pay | Admitting: Orthopaedic Surgery

## 2016-08-03 NOTE — Telephone Encounter (Signed)
Drenda Freezeurtis Carpenter with [employer] called needing return to work status for the patient. He advised he received two different notes. The number to contact him is [phone]

## 2016-08-07 NOTE — Telephone Encounter (Signed)
Christian Roberson aware that patient is to return 08/10/16

## 2016-08-19 ENCOUNTER — Telehealth (INDEPENDENT_AMBULATORY_CARE_PROVIDER_SITE_OTHER): Payer: Self-pay | Admitting: *Deleted

## 2016-08-19 ENCOUNTER — Encounter (INDEPENDENT_AMBULATORY_CARE_PROVIDER_SITE_OTHER): Payer: Self-pay | Admitting: Orthopaedic Surgery

## 2016-08-19 ENCOUNTER — Ambulatory Visit (INDEPENDENT_AMBULATORY_CARE_PROVIDER_SITE_OTHER): Payer: Self-pay

## 2016-08-19 ENCOUNTER — Ambulatory Visit (INDEPENDENT_AMBULATORY_CARE_PROVIDER_SITE_OTHER): Payer: Worker's Compensation | Admitting: Orthopaedic Surgery

## 2016-08-19 VITALS — BP 147/83 | HR 87 | Ht 72.0 in | Wt 198.0 lb

## 2016-08-19 DIAGNOSIS — S93324D Dislocation of tarsometatarsal joint of right foot, subsequent encounter: Secondary | ICD-10-CM

## 2016-08-19 NOTE — Telephone Encounter (Signed)
Pt called stating he needed to make an appt for [facility] ([facility])  But wasn't sure when this should be

## 2016-08-19 NOTE — Telephone Encounter (Signed)
Per Dr. Ophelia CharterYates note today, patient will return in one month and he will rate then. I called patient to advise and he did not make appt when he left today. Scheduled patient for appt on 09/18/16 at 3:30p for follow up.

## 2016-08-19 NOTE — Progress Notes (Signed)
Office Visit Note   Patient: Christian Roberson           Date of Birth: 1981/06/06           MRN: [id number] Visit Date: 08/19/2016              Requested by: Kerman PasseyMelinda P Lada, MD [address] [place], [place] [id number] PCP: Baruch GoutyMelinda Lada, MD   Assessment & Plan: Visit Diagnoses:  1. Dislocation of tarsometatarsal joint of right foot, subsequent encounter     Plan: Return in one month and at that time I'll sign him in impairment rating.Since even walking outside his foot is at increased pain with some peroneal brevis irritation. We spine with a cane and uses left hand which help with his gait you try to decrease the amount of walking he's been doing until less symptoms resolve.  Follow-Up Instructions: No Follow-up on file.   Orders:  Orders Placed This Encounter  Procedures  . XR Foot Complete Right   No orders of the defined types were placed in this encounter.     Procedures: No procedures performed   Clinical Data: No additional findings.   Subjective: Chief Complaint  Patient presents with  . Right Foot - Follow-up    Patient returns for 6 week follow up right foot. He is status post ORIF Right 2nd MT fx and ORIF Right Lisfranc Joint on 05/20/2016.  He states that his foot is sore. He has gone back to work, but it has been difficult because he does a lot of walking. He takes Ibuprofen for pain.    Review of Systems  Constitutional: Negative for chills and diaphoresis.  HENT: Negative for ear discharge, ear pain and nosebleeds.   Eyes: Negative for discharge and visual disturbance.  Respiratory: Negative for cough, choking and shortness of breath.   Cardiovascular: Negative for chest pain and palpitations.  Gastrointestinal: Negative for abdominal distention and abdominal pain.  Endocrine: Negative for cold intolerance and heat intolerance.  Genitourinary: Negative for flank pain and hematuria.  Musculoskeletal: Negative.   Skin: Negative for rash and  wound.  Neurological: Negative for seizures and speech difficulty.  Hematological: Negative for adenopathy. Does not bruise/bleed easily.  Psychiatric/Behavioral: Negative for agitation and suicidal ideas.     Objective: Vital Signs: BP (!) 147/83   Pulse 87   Ht 6' (1.829 m)   Wt 198 lb (89.8 kg)   BMI 26.85 kg/m   Physical Exam  Constitutional: He is oriented to person, place, and time. He appears well-developed and well-nourished.  HENT:  Head: Normocephalic and atraumatic.  Eyes: EOM are normal. Pupils are equal, round, and reactive to light.  Neck: No tracheal deviation present. No thyromegaly present.  Cardiovascular: Normal rate.   Pulmonary/Chest: Effort normal. He has no wheezes.  Abdominal: Soft. Bowel sounds are normal.  Musculoskeletal:  Forefoot edema well-healed incision. He has tenderness over the fifth metatarsal at the peroneal brevis insertion. When he walks he sometimes been walking a little bit more on the outside part of his foot.  Neurological: He is alert and oriented to person, place, and time.  Skin: Skin is warm and dry. Capillary refill takes less than 2 seconds.  Psychiatric: He has a normal mood and affect. His behavior is normal. Judgment and thought content normal.    Ortho Exam  Specialty Comments:  No specialty comments available.  Imaging: Xr Foot Complete Right  Result Date: 08/19/2016 Three-view x-rays right foot obtained. This shows  post-ORIF second metatarsal and Lisfranc joint fixation. Position is maintained and there is interval healing. Impression: Post ORIF right foot. Maintain good position and fractures have healed.    PMFS History: Patient Active Problem List   Diagnosis Date Noted  . Closed dislocation of tarsometatarsal joint of right foot 07/07/2016  . Crush injury of right foot 05/18/2016   Past Medical History:  Diagnosis Date  . Chronic idiopathic thrombocytopenia (HCC)     Family History  Problem Relation Age  of Onset  . Leukemia Cousin   . Leukemia Cousin   . Leukemia Cousin   . Leukemia Maternal Uncle     Past Surgical History:  Procedure Laterality Date  . HAND SURGERY    . ORIF TOE FRACTURE Right 05/20/2016   Procedure: OPEN REDUCTION INTERNAL FIXATION (ORIF) RIGHT 2ND  METATARSAL (TOE) FRACTURE, OPEN REDUCTION INTERNAL FIXATION LISFRANC JOINT;  Surgeon: Eldred MangesMark C Yates, MD;  Location: [facility] OR;  Service: Orthopedics;  Laterality: Right;   Social History   Occupational History  . Not on file.   Social History Main Topics  . Smoking status: Current Some Day Smoker  . Smokeless tobacco: Never Used  . Alcohol use Yes  . Drug use: Unknown  . Sexual activity: Not on file

## 2016-08-19 NOTE — Telephone Encounter (Signed)
[facility] Roberson from [facility] called for last office note and pt work status faxed FAX: [phone]

## 2016-08-21 NOTE — Telephone Encounter (Signed)
faxed

## 2016-09-18 ENCOUNTER — Ambulatory Visit (INDEPENDENT_AMBULATORY_CARE_PROVIDER_SITE_OTHER): Payer: Self-pay

## 2016-09-18 ENCOUNTER — Ambulatory Visit (INDEPENDENT_AMBULATORY_CARE_PROVIDER_SITE_OTHER): Payer: Worker's Compensation | Admitting: Orthopaedic Surgery

## 2016-09-18 ENCOUNTER — Encounter (INDEPENDENT_AMBULATORY_CARE_PROVIDER_SITE_OTHER): Payer: Self-pay | Admitting: Orthopaedic Surgery

## 2016-09-18 VITALS — BP 141/83 | HR 86 | Ht 72.0 in | Wt 198.0 lb

## 2016-09-18 DIAGNOSIS — S93324A Dislocation of tarsometatarsal joint of right foot, initial encounter: Secondary | ICD-10-CM | POA: Insufficient documentation

## 2016-09-18 DIAGNOSIS — S9781XD Crushing injury of right foot, subsequent encounter: Secondary | ICD-10-CM

## 2016-09-18 DIAGNOSIS — S93324D Dislocation of tarsometatarsal joint of right foot, subsequent encounter: Secondary | ICD-10-CM

## 2016-09-18 NOTE — Progress Notes (Signed)
Office Visit Note   Patient: Christian Roberson           Date of Birth: 1980/10/08           MRN: [id number] Visit Date: 09/18/2016              Requested by: Kerman Passey, MD [address] [place], [place] [address] PCP: Baruch Gouty, MD   Assessment & Plan: Visit Diagnoses:  1. Dislocation of tarsometatarsal joint of right foot, subsequent encounter   2. Crushing injury of right foot, subsequent encounter     Plan: Based on the patient's foot crush injury from a forklift with the open reduction internal fixation stabilization Lisfranc joint, plating of the second metatarsal which is subsequently healed and stabilization the tarsometatarsal joint I would rate his impairment at 15% of the right foot. Patient is at MMI. Office follow-up when necessary I do not anticipate he will require any further surgery. He is released from care.  Follow-Up Instructions: No Follow-up on file.   Orders:  Orders Placed This Encounter  Procedures  . XR Foot Complete Right   No orders of the defined types were placed in this encounter.     Procedures: No procedures performed   Clinical Data: No additional findings.   Subjective: Chief Complaint  Patient presents with  . Right Foot - Follow-up    Patient returns for one month follow up. He is status post ORIF Right 2nd MT fracture and ORIF Right Lisfranc Joint 05/20/2016. He is supposed to receive a MMI rating for WC today per last office note. He states that he is doing well. He does have continued swelling and soreness.     Review of Systems 13 part review of systems up-to-date and is unchanged from last office visit. Patient sanitorium boots. He is back doing her normal work activities.   Objective: Vital Signs: BP (!) 141/83   Pulse 86   Ht 6' (1.829 m)   Wt 198 lb (89.8 kg)   BMI 26.85 kg/m   Physical Exam  Constitutional: He is oriented to person, place, and time. He appears well-developed and well-nourished.    HENT:  Head: Normocephalic and atraumatic.  Eyes: EOM are normal. Pupils are equal, round, and reactive to light.  Neck: No tracheal deviation present. No thyromegaly present.  Cardiovascular: Normal rate.   Pulmonary/Chest: Effort normal. He has no wheezes.  Abdominal: Soft. Bowel sounds are normal.  Musculoskeletal:  Incisions completely healed right foot. No erythema no cellulitis. Tarsometatarsal joint is stabilized arch is normal. Capillary refill of the toes is normal. Swelling of the dorsum is decreased and is beginning to have some reappearance of the normal veins that are seen. He still has some mild swelling but is decreasing each month.  Neurological: He is alert and oriented to person, place, and time.  Skin: Skin is warm and dry. Capillary refill takes less than 2 seconds.  Psychiatric: He has a normal mood and affect. His behavior is normal. Judgment and thought content normal.    Ortho Exam  Specialty Comments:  No specialty comments available.  Imaging: Xr Foot Complete Right  Result Date: 09/18/2016 Three-view x-rays right foot obtained AP lateral oblique this shows complete healing and ORIF second metatarsal and stabilization of the Lisfranc joint. Small amount of dorsal spurring at the tarsometatarsal joint. Impression: Satisfactory postop x-rays complete healing of the fractures.    PMFS History: Patient Active Problem List   Diagnosis Date Noted  .  Dislocation of tarsometatarsal joint of right foot 09/18/2016  . Closed dislocation of tarsometatarsal joint of right foot 07/07/2016  . Crush injury of right foot 05/18/2016   Past Medical History:  Diagnosis Date  . Chronic idiopathic thrombocytopenia (HCC)     Family History  Problem Relation Age of Onset  . Leukemia Cousin   . Leukemia Cousin   . Leukemia Cousin   . Leukemia Maternal Uncle     Past Surgical History:  Procedure Laterality Date  . HAND SURGERY    . ORIF TOE FRACTURE Right 05/20/2016    Procedure: OPEN REDUCTION INTERNAL FIXATION (ORIF) RIGHT 2ND  METATARSAL (TOE) FRACTURE, OPEN REDUCTION INTERNAL FIXATION LISFRANC JOINT;  Surgeon: Eldred MangesMark C Yates, MD;  Location: [facility] OR;  Service: Orthopedics;  Laterality: Right;   Social History   Occupational History  . Not on file.   Social History Main Topics  . Smoking status: Current Some Day Smoker  . Smokeless tobacco: Never Used  . Alcohol use Yes  . Drug use: Unknown  . Sexual activity: Not on file

## 2016-10-07 ENCOUNTER — Telehealth (INDEPENDENT_AMBULATORY_CARE_PROVIDER_SITE_OTHER): Payer: Self-pay

## 2016-10-07 NOTE — Telephone Encounter (Signed)
Received voicemail from work comp adj Systems analystCurtis Carpenter requesting the last office note for this pt. Faxed the 09/18/16 note to [phone].

## 2016-12-20 IMAGING — CT CT ABD-PELV W/ CM
1 of 2 series · 15 of 32 positions shown, 19 images · IV contrast (omnipaque)
Comparison: None.

CLINICAL DATA: Left groin pain with bulging area for 5 days.

EXAM:
CT ABDOMEN AND PELVIS WITH CONTRAST
TECHNIQUE: Multidetector CT imaging of the abdomen and pelvis was performed
using the standard protocol following bolus administration of
intravenous contrast.
CONTRAST:  100mL OMNIPAQUE IOHEXOL 300 MG/ML  SOLN

[Series 2: routine abd pel with · axial · 0.76mm/px · z∈[-624,-180]mm · 15 of 99 slices shown, 19 images]
[im 5/99  soft-tissue]
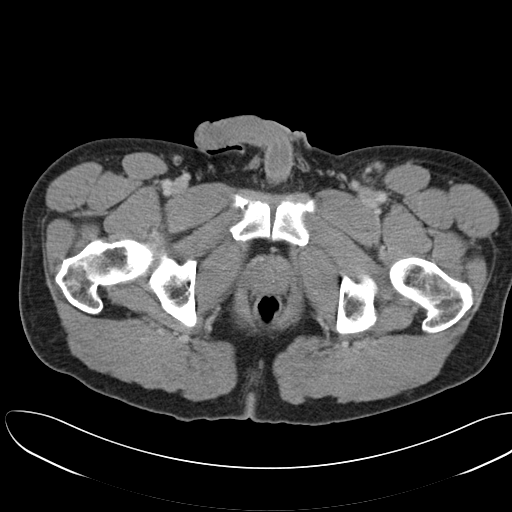
[im 5/99  bone]
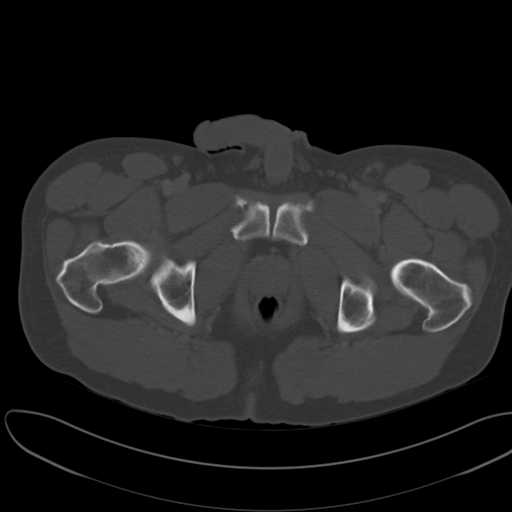
[im 13/99  soft-tissue]
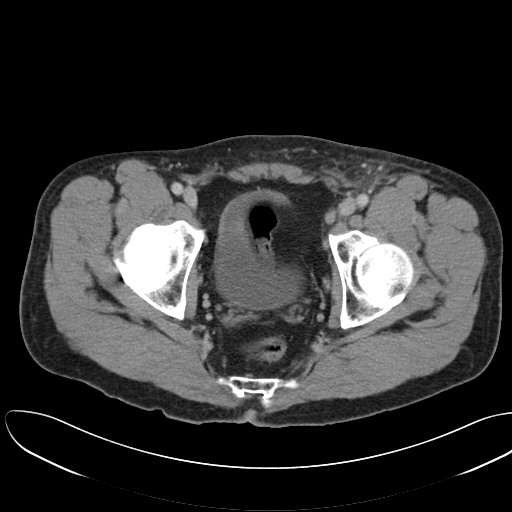
[im 21/99  soft-tissue]
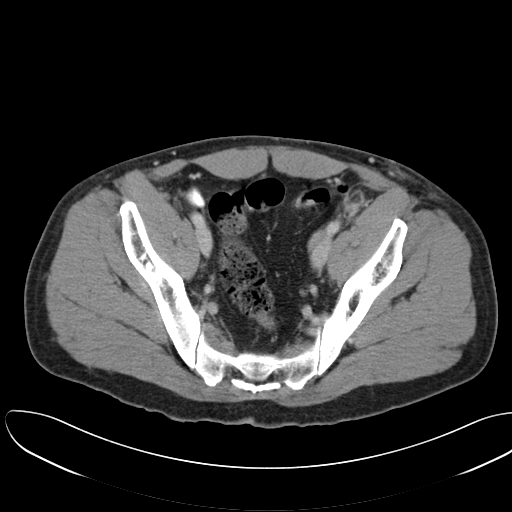
[im 29/99  soft-tissue]
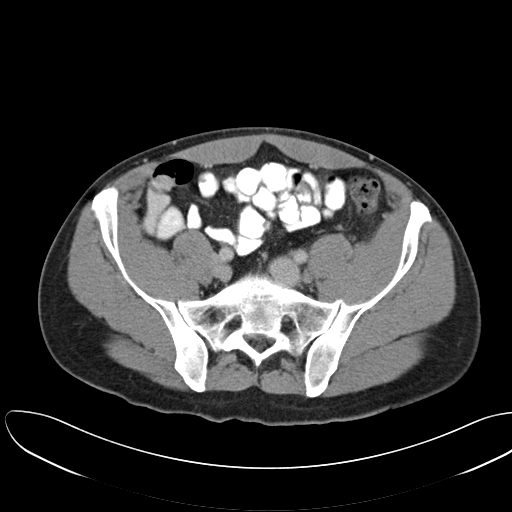
[im 33/99  soft-tissue]
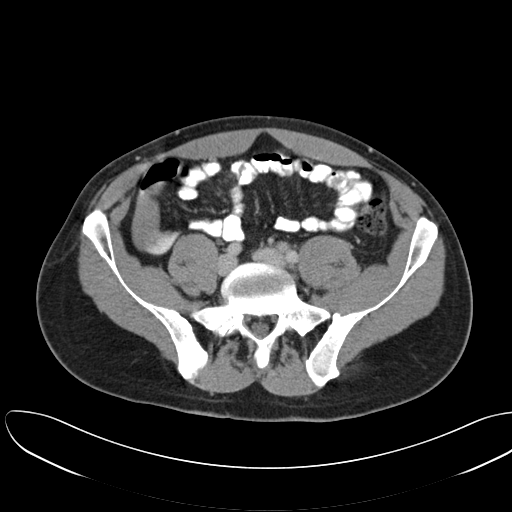
[im 41/99  soft-tissue]
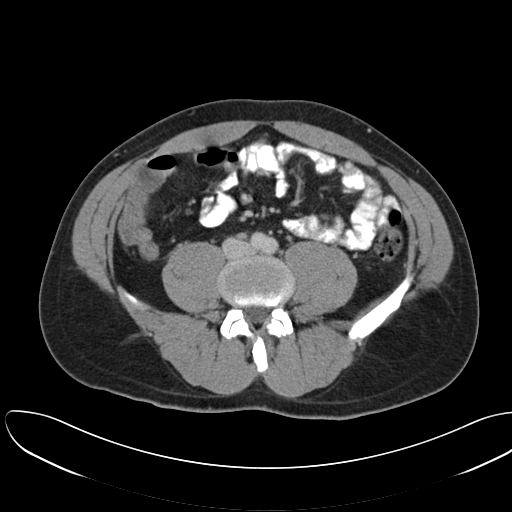
[im 50/99  soft-tissue]
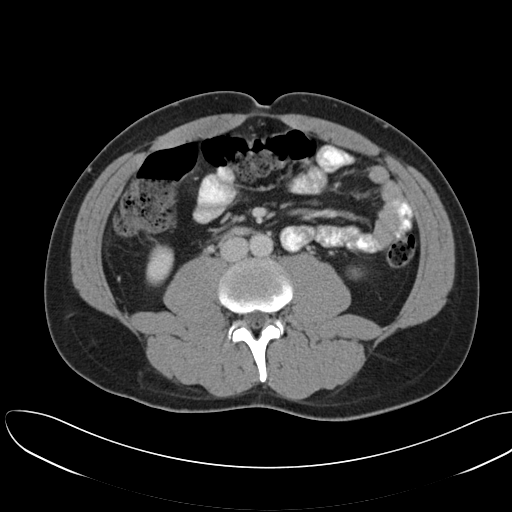
[im 58/99  soft-tissue]
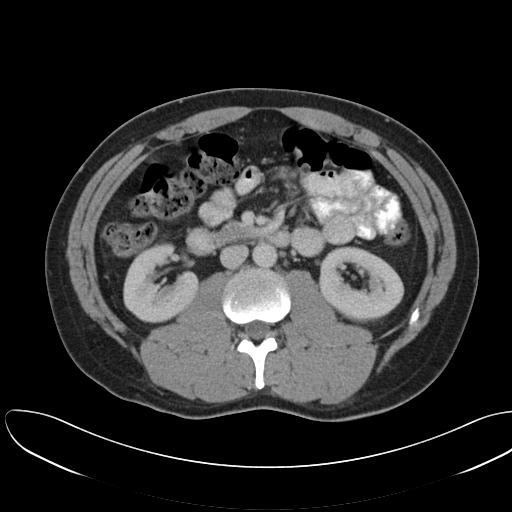
[im 66/99  soft-tissue]
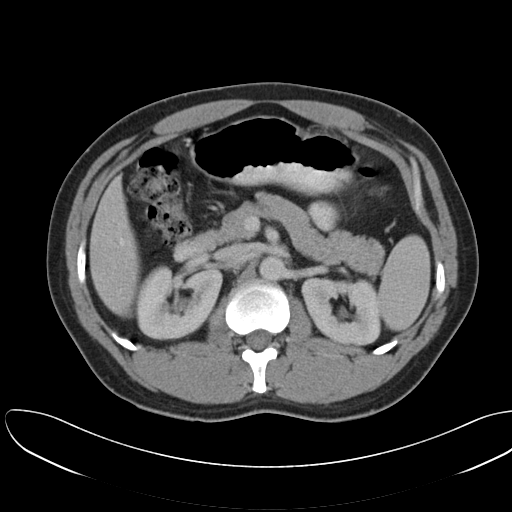
[im 66/99  bone]
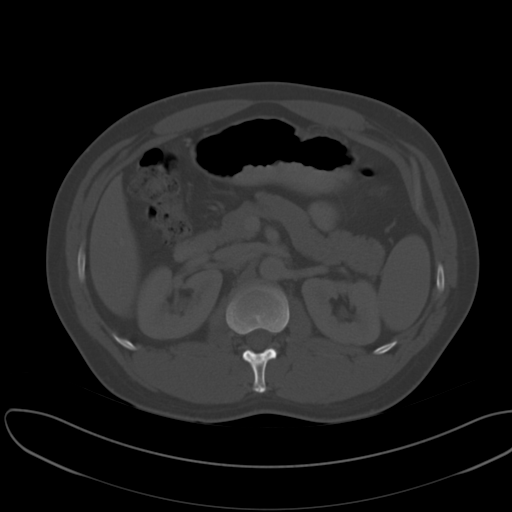
[im 70/99  soft-tissue]
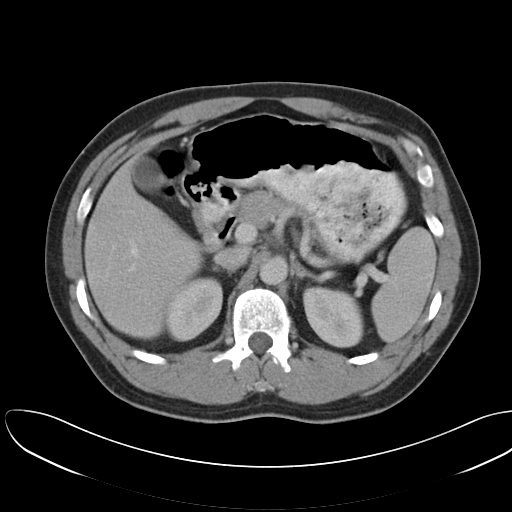
[im 78/99  soft-tissue]
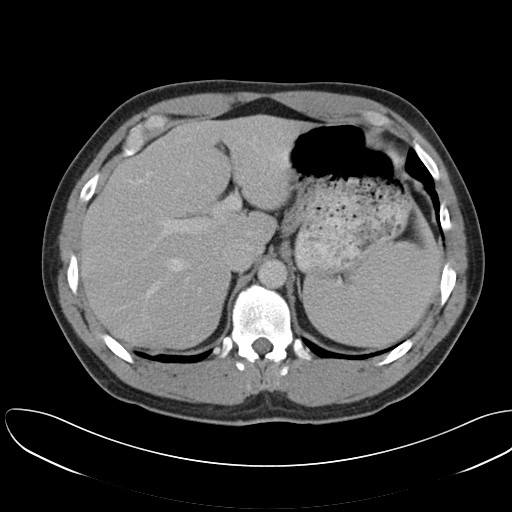
[im 82/99  lung]
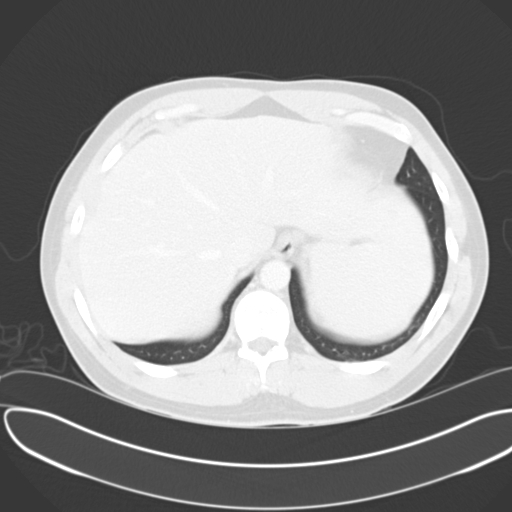
[im 86/99  soft-tissue]
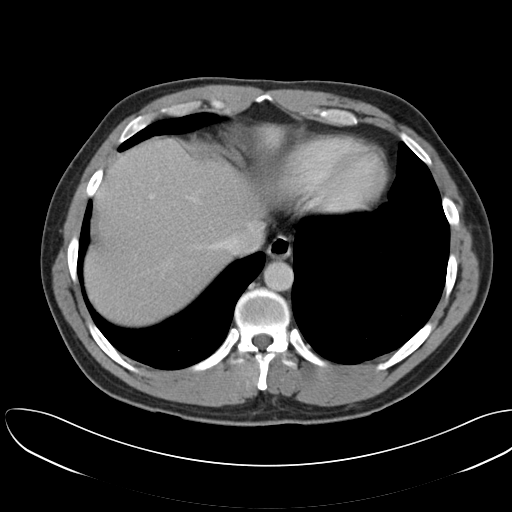
[im 86/99  lung]
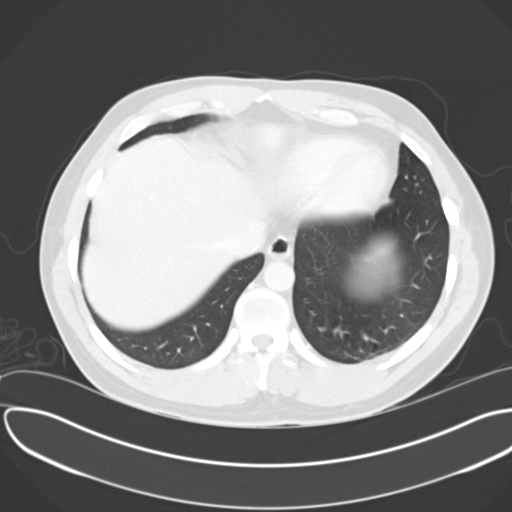
[im 90/99  lung]
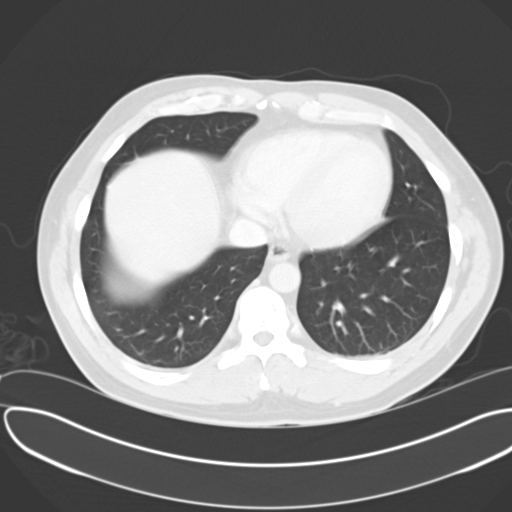
[im 94/99  soft-tissue]
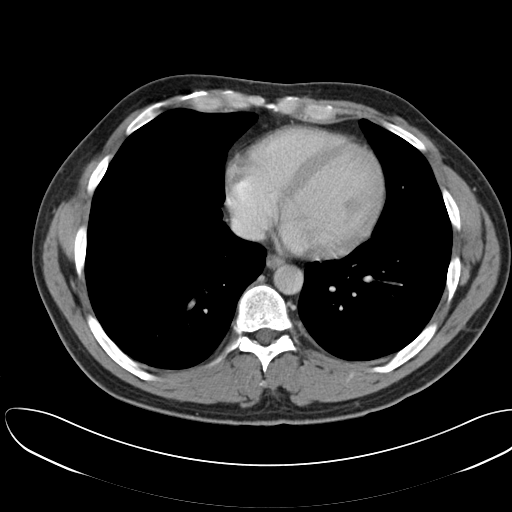
[im 94/99  lung]
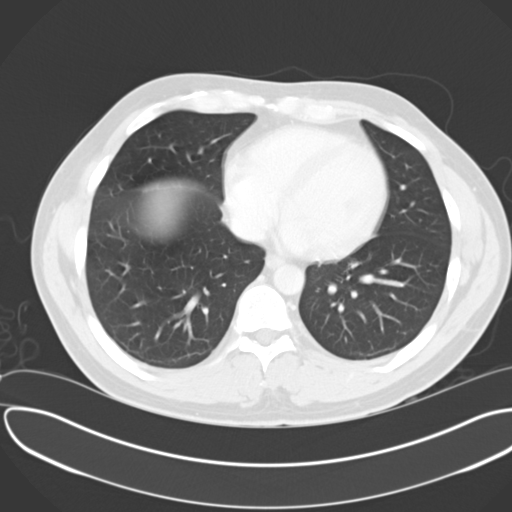

[15 of 32 positions shown; findings below may reference images not displayed]

FINDINGS: There are several enlarged lymph nodes within the left
groin/inguinal region, largest measuring 2.3 x 1.7 cm (series 2,
image 62), with surrounding ill-defined soft tissue edema.
Additional mildly prominent lymphadenopathy noted along the left
iliac chain region. No circumscribed fluid collection or abscess
like collection in this area.

The bowel is normal in caliber and configuration. No bowel wall
thickening or evidence of bowel wall inflammation. No bowel hernia.
No free fluid or abscess collection within the abdomen or pelvis. No
free intraperitoneal air.

Liver, spleen, pancreas, gallbladder, and adrenal glands are normal.
2 mm nonobstructing stone noted within the left renal pelvis. Right
kidney appears normal without stone or hydronephrosis. No ureteral
or bladder calculi identified. Abdominal aorta is normal in caliber.
Lung bases are clear. No significant osseous abnormality.
IMPRESSION: Several enlarged lymph nodes within the superficial subcutaneous
soft tissues of the left groin/inguinal region, largest measuring
2.3 x 1.7 cm, with surrounding ill-defined soft tissue edema. This
most likely indicates an infectious or inflammatory lymphadenitis.
Alternatively, this could represent localized cellulitis with
reactive lymphadenopathy. Recommend follow-up with physical
examination to ensure resolution. If persists or worsens, neoplastic
lymphadenopathy would not be excluded and biopsy would be
recommended to ensure benignity.

Remainder of the abdomen and pelvis CT is unremarkable. 2 mm
nonobstructing left renal stone without hydronephrosis.

## 2021-02-11 ENCOUNTER — Other Ambulatory Visit: Payer: Self-pay

## 2021-02-11 ENCOUNTER — Telehealth: Payer: BC Managed Care – PPO | Admitting: Adult Health

## 2021-02-11 ENCOUNTER — Encounter: Payer: Self-pay | Admitting: Internal Medicine

## 2021-02-11 ENCOUNTER — Ambulatory Visit: Payer: BC Managed Care – PPO | Admitting: Internal Medicine

## 2021-02-11 VITALS — BP 142/92 | HR 76 | Ht 72.0 in | Wt 212.4 lb

## 2021-02-11 DIAGNOSIS — F17200 Nicotine dependence, unspecified, uncomplicated: Secondary | ICD-10-CM | POA: Diagnosis not present

## 2021-02-11 DIAGNOSIS — S93324D Dislocation of tarsometatarsal joint of right foot, subsequent encounter: Secondary | ICD-10-CM

## 2021-02-11 DIAGNOSIS — M12811 Other specific arthropathies, not elsewhere classified, right shoulder: Secondary | ICD-10-CM

## 2021-02-11 DIAGNOSIS — R0683 Snoring: Secondary | ICD-10-CM

## 2021-02-11 DIAGNOSIS — Z7689 Persons encountering health services in other specified circumstances: Secondary | ICD-10-CM

## 2021-02-11 NOTE — Assessment & Plan Note (Signed)
Patient was suggested sleep study but he does not want to do it at this time

## 2021-02-11 NOTE — Assessment & Plan Note (Signed)
Refer to orthopedic surgeon patient has swelling of the right foot

## 2021-02-11 NOTE — Assessment & Plan Note (Signed)
-   I instructed the patient to stop smoking and provided them with smoking cessation materials.  - I informed the patient that smoking puts them at increased risk for cancer, COPD, hypertension, and more.  - Informed the patient to seek help if they begin to have trouble breathing, develop chest pain, start to cough up blood, feel faint, or pass out.  

## 2021-02-11 NOTE — Progress Notes (Signed)
New Patient Office Visit  Subjective:  Patient ID: Christian Roberson, male    DOB: 1980-09-02  Age: 40 y.o. MRN: [id number]  CC:  Chief Complaint  Patient presents with   New Patient (Initial Visit)    HPI Patient presents for follow-up checkup.  Patient has a closed dislocation of the right foot.  Also has a dislocation of the tarsometatarsal joint of the right foot which was treated in the past.  Raising his right arm above the shoulder he goes to the gym regularly   also has a rotator arthropathy of the right shoulder.  Patient other problem include nasal congestion snoring tiredness in the morning hoarseness of voice.  He smokes 1 pack/day.  He also complains of without expectoration.  Complains of swelling of the foot.  There is no history of any psychiatric problem GI problem or cardiovascular problem.  He is not diabetic, blood pressure is under control.  Past Medical History:  Diagnosis Date   Chronic idiopathic thrombocytopenia (HCC)     No current outpatient medications on file.   Past Surgical History:  Procedure Laterality Date   HAND SURGERY     ORIF TOE FRACTURE Right 05/20/2016   Procedure: OPEN REDUCTION INTERNAL FIXATION (ORIF) RIGHT 2ND  METATARSAL (TOE) FRACTURE, OPEN REDUCTION INTERNAL FIXATION LISFRANC JOINT;  Surgeon: Eldred Manges, MD;  Location: [facility] OR;  Service: Orthopedics;  Laterality: Right;    Family History  Problem Relation Age of Onset   Hypertension Father    Heart disease Father    Cancer Maternal Uncle    Leukemia Maternal Uncle    Leukemia Cousin    Leukemia Cousin    Leukemia Cousin     Social History   Socioeconomic History   Marital status: Single    Spouse name: Not on file   Number of children: Not on file   Years of education: Not on file   Highest education level: Not on file  Occupational History   Not on file  Tobacco Use   Smoking status: Some Days    Pack years: 0.00   Smokeless tobacco: Never  Substance and Sexual  Activity   Alcohol use: Yes   Drug use: Not on file   Sexual activity: Not on file  Other Topics Concern   Not on file  Social History Narrative   Not on file   Social Determinants of Health   Financial Resource Strain: Not on file  Food Insecurity: Not on file  Transportation Needs: Not on file  Physical Activity: Not on file  Stress: Not on file  Social Connections: Not on file  Intimate Partner Violence: Not on file    ROS Review of Systems  Constitutional: Negative.   HENT: Negative.    Eyes: Negative.   Respiratory: Negative.    Cardiovascular: Negative.   Gastrointestinal: Negative.   Endocrine: Negative.   Genitourinary: Negative.   Musculoskeletal: Negative.   Skin: Negative.   Allergic/Immunologic: Negative.   Neurological: Negative.   Hematological: Negative.   Psychiatric/Behavioral: Negative.    All other systems reviewed and are negative.  Objective:   Today's Vitals: BP (!) 142/92   Pulse 76   Ht 6' (1.829 m)   Wt 212 lb 6.4 oz (96.3 kg)   BMI 28.81 kg/m   Physical Exam Constitutional:      Appearance: Normal appearance.  HENT:     Head: Normocephalic.     Right Ear: Tympanic membrane normal.     Nose:  Nose normal.     Mouth/Throat:     Mouth: Mucous membranes are moist.     Pharynx: No oropharyngeal exudate.  Cardiovascular:     Rate and Rhythm: Normal rate.     Pulses: Normal pulses.     Heart sounds: No murmur heard. Pulmonary:     Effort: Pulmonary effort is normal.  Abdominal:     General: There is no distension.     Tenderness: There is no abdominal tenderness. There is no right CVA tenderness or guarding.     Hernia: No hernia is present.  Musculoskeletal:        General: No swelling or deformity.     Cervical back: Normal range of motion and neck supple.  Skin:    Coloration: Skin is not jaundiced.  Neurological:     General: No focal deficit present.     Mental Status: He is alert.     Cranial Nerves: No cranial nerve  deficit.     Motor: No weakness.  Psychiatric:        Mood and Affect: Mood normal.    Assessment & Plan:   Problem List Items Addressed This Visit       Musculoskeletal and Integument   Closed dislocation of tarsometatarsal joint of right foot - Primary    Refer to orthopedic surgeon patient has swelling of the right foot       Dislocation of tarsometatarsal joint of right foot   Rotator cuff arthropathy of right shoulder    Patient was advised to go to the gym trainer and get the exercises on the rotator cuff on the right side he was also seen by an orthopedic surgeon         Other   Snoring    Patient was suggested sleep study but he does not want to do it at this time       Smoker    - I instructed the patient to stop smoking and provided them with smoking cessation materials.  - I informed the patient that smoking puts them at increased risk for cancer, COPD, hypertension, and more.  - Informed the patient to seek help if they begin to have trouble breathing, develop chest pain, start to cough up blood, feel faint, or pass out.        Outpatient Encounter Medications as of 02/11/2021  Medication Sig   [DISCONTINUED] aspirin (BAYER ASPIRIN) 325 MG tablet Take 1 tablet (325 mg total) by mouth daily. (Patient not taking: No sig reported)   [DISCONTINUED] ibuprofen (MOTRIN IB) 200 MG tablet Take 3 tablets (600 mg total) by mouth every 6 (six) hours as needed. (Patient not taking: Reported on 02/11/2021)   [DISCONTINUED] oxyCODONE-acetaminophen (ROXICET) 5-325 MG tablet Take 1-2 tablets by mouth every 4 (four) hours as needed for severe pain. (Patient not taking: No sig reported)   No facility-administered encounter medications on file as of 02/11/2021.    The 4 Principles of Healthy Living 1. Do Not Smoke.  2. Maintain a BMI<30.  3. Exercise 150 minutes/week. (30 minutes 5 days a week of some form of aerobic exercise)  4. Eat 5 servings of fruits or vegetables  daily.   Several studies have conclusively shown that individuals who do these things have a dramatic reduction in overall mortality, heart disease, diabetes, hypertension, stroke, congestive heart failure, and cancer. This means that without taking any pills, vitamins, tonics, etc - without spending a single penny - you can live a longer, healthier,  more productive life. Lets look at these 4 principles separately.   1. Do Not Smoke - Make up your mind to quit, talk with your doctor to formulate a plan, and set a date.  2. Get to and maintain a BMI<30. This gets you out of the obese category. No longer being obese will dramatically reduce you and your familys risk of a multitude of health problems. It is not easy, but, it is possible. If you are extremely obese it may take years, but, each step you take will lead to dramatic rewards. With just 20 pounds of weight loss most people feel better, have less fatigue and joint pain, and feel more energetic. If you have health problems like diabetes, hypertension, or high cholesterol you might do away with your need for some medications. And most of all, while you change you lifestyle to attain this goal you will set a good example for all those around you, especially your children. Here are two proven steps to help you start losing weight.  Portion Control - Eating your meals on a smaller plate and limiting the amount of calories you eat at each meal has been shown to lead to weight loss. The average plate is 10 inches in diameter. A 10 inch plate piled high with food can add up to 1500 calories (even more if you go back for seconds). The typical man needs 2000 calories per day total. Try using a smaller plate (8 inch paper plate or 7 inch saucer) at each meal and NEVER go back for second helpings.  Pedometer - individuals who wear a pedometer and try to walk 10,000 steps each day increase their physical activity, lose weight, and decrease their blood  pressures.  3. Exercise 150 minutes/week. The overall health benefits of regular aerobic exercise are overwhelming:  Reduces the risk of dying prematurely. Reduces the risk of dying from heart disease.  Reduces the risk of stroke.  Reduces the risk of developing diabetes.  Reduces the risk of developing high blood pressure.  Helps reduce blood pressure in people who already have high blood pressure.  Reduces the risk of developing colon cancer.  Reduces feelings of depression and anxiety.  Helps control weight.  Helps build and maintain healthy bones, muscles and joints.  Helps older adults become stronger and better able to move about without falling.  Promotes psychological well-being.   We recommend starting slow and working up. Start by reading the [facility] on Starting an Exercise Program then get going. Do not over think the process. Pick something simple at home like walking with friends, using a treadmill, or riding an exercise bike. Experiment with different types of exercise until you find something you can tolerate (it does not have to be fun). Pick a 30 minute disc of inspirational music and listen to it while you exercise. The more you do it the easier it will become and the better you will feel.  4. Eat 5 servings of fruits or vegetables daily.  A serving size is:  One medium-size fruit   1/2 cup raw, cooked, frozen or canned fruits (in 100% juice) or vegetables   3/4 cup (6 oz.) 100% fruit or vegetable juice   1 cup raw, leafy vegetables   1/4 cup dried fruit   While this sounds easy enough actually getting this much fruits and vegetables takes some work and planning. You will need to experiment with different types of fruits and vegetables to find ones you and your family  can eat every day. Some simple tips include:  - Add fruit to your cereal each morning. - Eat a salad each day for lunch. The typical bowl of salad counts for 2 servings of vegetables.  - Have  some fruits and vegetables at every meal. Use canned or frozen products if needed. - Eat fruits and vegetables for snacks - especially for the kids. - Replace the side of fries or chips with a cup of fruit, an apple, or a bowl of celery.  What are the benefits? Reduces heart disease and stroke. Possible reduction in cancer risk. Protects against the development of diabetes. Filling up on fat free fruits and vegetable decreases the amount of high fat foods you will eat, aiding in weight loss.     Corky Downs, MD

## 2021-02-11 NOTE — Assessment & Plan Note (Signed)
Patient was advised to go to the gym trainer and get the exercises on the rotator cuff on the right side he was also seen by an orthopedic surgeon

## 2021-02-12 LAB — COMPLETE METABOLIC PANEL WITH GFR
AG Ratio: 2.3 (calc) (ref 1.0–2.5)
ALT: 39 U/L (ref 9–46)
AST: 42 U/L — ABNORMAL HIGH (ref 10–40)
Albumin: 5.3 g/dL — ABNORMAL HIGH (ref 3.6–5.1)
Alkaline phosphatase (APISO): 71 U/L (ref 36–130)
BUN: 18 mg/dL (ref 7–25)
CO2: 24 mmol/L (ref 20–32)
Calcium: 10.1 mg/dL (ref 8.6–10.3)
Chloride: 101 mmol/L (ref 98–110)
Creat: 1.05 mg/dL (ref 0.60–1.35)
GFR, Est African American: 104 mL/min/{1.73_m2} (ref >=60)
GFR, Est Non African American: 90 mL/min/{1.73_m2} (ref >=60)
Globulin: 2.3 g/dL (calc) (ref 1.9–3.7)
Glucose, Bld: 77 mg/dL (ref 65–99)
Potassium: 4.4 mmol/L (ref 3.5–5.3)
Sodium: 138 mmol/L (ref 135–146)
Total Bilirubin: 0.5 mg/dL (ref 0.2–1.2)
Total Protein: 7.6 g/dL (ref 6.1–8.1)

## 2021-02-12 LAB — CBC WITH DIFFERENTIAL/PLATELET
Absolute Monocytes: 461 cells/uL (ref 200–950)
Basophils Absolute: 19 cells/uL (ref 0–200)
Basophils Relative: 0.4 %
Eosinophils Absolute: 29 cells/uL (ref 15–500)
Eosinophils Relative: 0.6 %
HCT: 44.5 % (ref 38.5–50.0)
Hemoglobin: 15.2 g/dL (ref 13.2–17.1)
Lymphs Abs: 1834 cells/uL (ref 850–3900)
MCH: 35.5 pg — ABNORMAL HIGH (ref 27.0–33.0)
MCHC: 34.2 g/dL (ref 32.0–36.0)
MCV: 104 fL — ABNORMAL HIGH (ref 80.0–100.0)
MPV: 13.6 fL — ABNORMAL HIGH (ref 7.5–12.5)
Monocytes Relative: 9.6 %
Neutro Abs: 2458 cells/uL (ref 1500–7800)
Neutrophils Relative %: 51.2 %
Platelets: 61 10*3/uL — ABNORMAL LOW (ref 140–400)
RBC: 4.28 10*6/uL (ref 4.20–5.80)
RDW: 12.8 % (ref 11.0–15.0)
Total Lymphocyte: 38.2 %
WBC: 4.8 10*3/uL (ref 3.8–10.8)

## 2021-02-12 LAB — LIPID PANEL
Cholesterol: 236 mg/dL — ABNORMAL HIGH (ref <200)
HDL: 60 mg/dL (ref >=40)
LDL Cholesterol (Calc): 141 mg/dL (calc) — ABNORMAL HIGH
Non-HDL Cholesterol (Calc): 176 mg/dL (calc) — ABNORMAL HIGH (ref <130)
Total CHOL/HDL Ratio: 3.9 (calc) (ref <5.0)
Triglycerides: 212 mg/dL — ABNORMAL HIGH (ref <150)

## 2021-02-12 LAB — TSH: TSH: 1.55 mIU/L (ref 0.40–4.50)

## 2021-02-17 ENCOUNTER — Encounter: Payer: Self-pay | Admitting: Internal Medicine

## 2021-02-17 ENCOUNTER — Ambulatory Visit: Payer: BC Managed Care – PPO | Admitting: Internal Medicine

## 2021-02-17 ENCOUNTER — Other Ambulatory Visit: Payer: Self-pay

## 2021-02-17 VITALS — BP 140/90 | HR 82 | Ht 72.0 in | Wt 214.1 lb

## 2021-02-17 DIAGNOSIS — F17200 Nicotine dependence, unspecified, uncomplicated: Secondary | ICD-10-CM | POA: Diagnosis not present

## 2021-02-17 DIAGNOSIS — M12811 Other specific arthropathies, not elsewhere classified, right shoulder: Secondary | ICD-10-CM

## 2021-02-17 DIAGNOSIS — S93324D Dislocation of tarsometatarsal joint of right foot, subsequent encounter: Secondary | ICD-10-CM

## 2021-02-17 NOTE — Assessment & Plan Note (Signed)
-   I instructed the patient to stop smoking and provided them with smoking cessation materials.  - I informed the patient that smoking puts them at increased risk for cancer, COPD, hypertension, and more.  - Informed the patient to seek help if they begin to have trouble breathing, develop chest pain, start to cough up blood, feel faint, or pass out.  Patient was also advised to stop drinking completely he drinks wine so I told him to drink only 1 glass of wine a day.  He was also advised to follow Mediterranean diet.

## 2021-02-17 NOTE — Assessment & Plan Note (Signed)
Patient is seen the orthopedic surgeon

## 2021-02-17 NOTE — Assessment & Plan Note (Signed)
Suggest shoulder exercises ?

## 2021-02-17 NOTE — Progress Notes (Signed)
Established Patient Office Visit  Subjective:  Patient ID: Christian Roberson, male    DOB: September 14, 1980  Age: 40 y.o. MRN: [id number]  CC:  Chief Complaint  Patient presents with   lab results    HPI  Christian Roberson presents for lab review Past Medical History:  Diagnosis Date   Chronic idiopathic thrombocytopenia [facility])     Past Surgical History:  Procedure Laterality Date   HAND SURGERY     ORIF TOE FRACTURE Right 05/20/2016   Procedure: OPEN REDUCTION INTERNAL FIXATION (ORIF) RIGHT 2ND  METATARSAL (TOE) FRACTURE, OPEN REDUCTION INTERNAL FIXATION LISFRANC JOINT;  Surgeon: Eldred Manges, MD;  Location: [facility] OR;  Service: Orthopedics;  Laterality: Right;    Family History  Problem Relation Age of Onset   Hypertension Father    Heart disease Father    Cancer Maternal Uncle    Leukemia Maternal Uncle    Leukemia Cousin    Leukemia Cousin    Leukemia Cousin     Social History   Socioeconomic History   Marital status: Single    Spouse name: Not on file   Number of children: Not on file   Years of education: Not on file   Highest education level: Not on file  Occupational History   Not on file  Tobacco Use   Smoking status: Some Days    Pack years: 0.00   Smokeless tobacco: Never  Substance and Sexual Activity   Alcohol use: Yes   Drug use: Not on file   Sexual activity: Not on file  Other Topics Concern   Not on file  Social History Narrative   Not on file   Social Determinants of Health   Financial Resource Strain: Not on file  Food Insecurity: Not on file  Transportation Needs: Not on file  Physical Activity: Not on file  Stress: Not on file  Social Connections: Not on file  Intimate Partner Violence: Not on file    No current outpatient medications on file.   No Known Allergies  ROS Review of Systems  Constitutional: Negative.   HENT: Negative.    Eyes: Negative.   Respiratory: Negative.    Cardiovascular: Negative.   Gastrointestinal: Negative.    Endocrine: Negative.   Genitourinary: Negative.   Musculoskeletal: Negative.   Skin: Negative.   Allergic/Immunologic: Negative.   Neurological: Negative.   Hematological: Negative.   Psychiatric/Behavioral: Negative.    All other systems reviewed and are negative.    Objective:    Physical Exam Vitals reviewed.  Constitutional:      Appearance: Normal appearance.  HENT:     Mouth/Throat:     Mouth: Mucous membranes are moist.  Eyes:     Pupils: Pupils are equal, round, and reactive to light.  Neck:     Vascular: No carotid bruit.  Cardiovascular:     Rate and Rhythm: Normal rate and regular rhythm.     Pulses: Normal pulses.     Heart sounds: Normal heart sounds.  Pulmonary:     Effort: Pulmonary effort is normal.     Breath sounds: Normal breath sounds.  Abdominal:     General: Bowel sounds are normal.     Palpations: Abdomen is soft. There is no hepatomegaly, splenomegaly or mass.     Tenderness: There is no abdominal tenderness.     Hernia: No hernia is present.  Musculoskeletal:     Cervical back: Neck supple.     Right lower leg: No edema.  Left lower leg: No edema.  Skin:    Findings: No rash.  Neurological:     Mental Status: He is alert and oriented to person, place, and time.     Motor: No weakness.  Psychiatric:        Mood and Affect: Mood normal.        Behavior: Behavior normal.    BP 140/90   Pulse 82   Ht 6' (1.829 m)   Wt 214 lb 1.6 oz (97.1 kg)   BMI 29.04 kg/m  Wt Readings from Last 3 Encounters:  02/17/21 214 lb 1.6 oz (97.1 kg)  02/11/21 212 lb 6.4 oz (96.3 kg)  09/18/16 198 lb (89.8 kg)     Health Maintenance Due  Topic Date Due   COVID-19 Vaccine (1) Never done   Pneumococcal Vaccine 50-84 Years old (1 - PCV) Never done   HIV Screening  Never done   Hepatitis C Screening  Never done   TETANUS/TDAP  Never done    There are no preventive care reminders to display for this patient.  Lab Results  Component Value Date    TSH 1.55 02/11/2021   Lab Results  Component Value Date   WBC 4.8 02/11/2021   HGB 15.2 02/11/2021   HCT 44.5 02/11/2021   MCV 104.0 (H) 02/11/2021   PLT 61 (L) 02/11/2021   Lab Results  Component Value Date   NA 138 02/11/2021   K 4.4 02/11/2021   CO2 24 02/11/2021   GLUCOSE 77 02/11/2021   BUN 18 02/11/2021   CREATININE 1.05 02/11/2021   BILITOT 0.5 02/11/2021   ALKPHOS 68 04/19/2015   AST 42 (H) 02/11/2021   ALT 39 02/11/2021   PROT 7.6 02/11/2021   ALBUMIN 4.5 04/19/2015   CALCIUM 10.1 02/11/2021   ANIONGAP 10 05/18/2016   Lab Results  Component Value Date   CHOL 236 (H) 02/11/2021   Lab Results  Component Value Date   HDL 60 02/11/2021   Lab Results  Component Value Date   LDLCALC 141 (H) 02/11/2021   Lab Results  Component Value Date   TRIG 212 (H) 02/11/2021   Lab Results  Component Value Date   CHOLHDL 3.9 02/11/2021   No results found for: HGBA1C    Assessment & Plan:   Problem List Items Addressed This Visit       Musculoskeletal and Integument   Closed dislocation of tarsometatarsal joint of right foot - Primary    Patient is seen the orthopedic surgeon       Rotator cuff arthropathy of right shoulder    Suggest shoulder exercises         Other   Smoker    - I instructed the patient to stop smoking and provided them with smoking cessation materials.  - I informed the patient that smoking puts them at increased risk for cancer, COPD, hypertension, and more.  - Informed the patient to seek help if they begin to have trouble breathing, develop chest pain, start to cough up blood, feel faint, or pass out.  Patient was also advised to stop drinking completely he drinks wine so I told him to drink only 1 glass of wine a day.  He was also advised to follow Mediterranean diet.      Hypercholesterolemia  I advised the patient to follow Mediterranean diet This diet is rich in fruits vegetables and whole grain, and This diet is also rich  in fish and lean meat Patient should also  eat a handful of almonds or walnuts daily Recent heart study indicated that average follow-up on this kind of diet reduces the cardiovascular mortality by 50 to 70%==  No orders of the defined types were placed in this encounter.   Follow-up: No follow-ups on file.    Corky Downs, MD

## 2021-02-26 ENCOUNTER — Ambulatory Visit: Payer: BC Managed Care – PPO | Admitting: Orthopedic Surgery

## 2021-02-26 ENCOUNTER — Ambulatory Visit: Payer: Self-pay

## 2021-02-26 ENCOUNTER — Encounter: Payer: Self-pay | Admitting: Orthopedic Surgery

## 2021-02-26 DIAGNOSIS — M25511 Pain in right shoulder: Secondary | ICD-10-CM | POA: Diagnosis not present

## 2021-02-26 DIAGNOSIS — M25571 Pain in right ankle and joints of right foot: Secondary | ICD-10-CM

## 2021-02-26 NOTE — Progress Notes (Signed)
Office Visit Note   Patient: Christian Roberson           Date of Birth: April 10, 1981           MRN: [id number] Visit Date: 02/26/2021 Requested by: Christian Downs, MD [address] [place],  [place] [address] PCP: Christian Downs, MD  Subjective: Chief Complaint  Patient presents with   Right Shoulder - Pain   Right Foot - Pain    HPI: Patient presents with right shoulder pain of 9 years duration.  He does like to work out a lot and has lifted heavy weights for most of his life.  He is right-hand dominant.  He states that he is having level 8-9 out of 10 pain on a daily basis.  9 years ago he was lifting weights and he felt a pop in both shoulders.  The left shoulder improved but the right shoulder pain persisted.  He was doing dumbbell presses.  Now he reports weakness and restricted motion in the shoulder.  Hurts him to drive.  Hard for him to sleep on the right-hand side.  He takes Tylenol and ibuprofen for the problem.  Patient also reports some right foot pain.  Hard for him to run because his second toe has limited dorsiflexion.  Reports a constant ache in the foot.  Describes swelling distal to the plate and incision.  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  Denies any other medical complaints              ROS: All systems reviewed are negative as they relate to the chief complaint within the history of present illness.  Patient denies  fevers or chills.   Assessment & Plan: Visit Diagnoses:  1. Pain in right ankle and joints of right foot   2. Right shoulder pain, unspecified chronicity     Plan: Impression is right shoulder pain with some narrowing of the acromiohumeral distance.  Patient has very muscular shoulder so is really difficult to assess any cytocide difference and weakness.  He does have some limitation of motion bilaterally.  I think it is possible he could have either occult arthritis not visible on radiographs versus superior labral tearing versus supraspinatus  rotator cuff tear.  Symptoms have been ongoing now for 9 years.  He has tried over-the-counter anti-inflammatories and has tried working out with an exercise regimen which only makes his symptoms worse.  This has been going on for over 6 weeks.  Well over 6 weeks.  Plan MRI arthrogram of the right shoulder for further evaluation.  Patient also has right foot pain.  Hardware intact.  No instability with physical examination of the tarsometatarsal joints.  No significant arthritic change.  Most of his pain localizes around the second toe which does have about 15 degrees less dorsiflexion and plantarflexion in the comparable left MTP joint.  Plan for that his home exercises to stretch out that MTP joint which may allow him to run.  Follow-Up Instructions: Return for after MRI.   Orders:  Orders Placed This Encounter  Procedures   XR Shoulder Right   XR Foot Complete Right   MR SHOULDER RIGHT W CONTRAST   Arthrogram   No orders of the defined types were placed in this encounter.     Procedures: No procedures performed   Clinical Data: No additional findings.  Objective: Vital Signs: There were no vitals taken for this visit.  Physical Exam:   Constitutional: Patient appears  well-developed HEENT:  Head: Normocephalic Eyes:EOM are normal Neck: Normal range of motion Cardiovascular: Normal rate Pulmonary/chest: Effort normal Neurologic: Patient is alert Skin: Skin is warm Psychiatric: Patient has normal mood and affect   Ortho Exam: Ortho exam demonstrates good cervical spine range of motion.  5 out of 5 grip EPL FPL interosseous wrist flexion extension bicep triceps and deltoid strength.  Passive range of motion both shoulders 35/95/170.  Patient has excellent rotator cuff strength infraspinatus supraspinatus and subscap muscle testing.  No discrete AC joint tenderness bilaterally.  No coarse grinding or crepitus with internal or external rotation of the arm.  He has equivocal  O'Brien's testing on the right negative on the left.  Motor or sensory function to the hand is intact  Specialty Comments:  No specialty comments available.  Imaging: XR Foot Complete Right  Result Date: 02/26/2021 AP lateral oblique radiographs right foot reviewed.  Plate fixation of second metatarsal shaft fracture in good position alignment with no hardware complications.  Screw fixation in the midfoot also present with no complicating features.  No significant arthritis in the TMT joints or MTP joints.  Forefoot alignment intact on nonweightbearing views.  XR Shoulder Right  Result Date: 02/26/2021 AP axillary outlet radiographs right shoulder reviewed.  There is some narrowing of the acromiohumeral distance.  No significant degenerative changes noted in the [facility] joint or glenohumeral joint.  Visualized lung fields clear.    PMFS History: Patient Active Problem List   Diagnosis Date Noted   Rotator cuff arthropathy of right shoulder 02/11/2021   Snoring 02/11/2021   Smoker 02/11/2021   Dislocation of tarsometatarsal joint of right foot 09/18/2016   Closed dislocation of tarsometatarsal joint of right foot 07/07/2016   Crush injury of right foot 05/18/2016   Past Medical History:  Diagnosis Date   Chronic idiopathic thrombocytopenia (HCC)     Family History  Problem Relation Age of Onset   Hypertension Father    Heart disease Father    Cancer Maternal Uncle    Leukemia Maternal Uncle    Leukemia Cousin    Leukemia Cousin    Leukemia Cousin     Past Surgical History:  Procedure Laterality Date   HAND SURGERY     ORIF TOE FRACTURE Right 05/20/2016   Procedure: OPEN REDUCTION INTERNAL FIXATION (ORIF) RIGHT 2ND  METATARSAL (TOE) FRACTURE, OPEN REDUCTION INTERNAL FIXATION LISFRANC JOINT;  Surgeon: Eldred Manges, MD;  Location: [facility] OR;  Service: Orthopedics;  Laterality: Right;   Social History   Occupational History   Not on file  Tobacco Use   Smoking status: Some Days     Pack years: 0.00   Smokeless tobacco: Never  Substance and Sexual Activity   Alcohol use: Yes   Drug use: Not on file   Sexual activity: Not on file

## 2021-08-11 ENCOUNTER — Ambulatory Visit: Payer: BC Managed Care – PPO | Admitting: Internal Medicine

## 2021-10-08 ENCOUNTER — Ambulatory Visit: Payer: BC Managed Care – PPO | Admitting: Urology

## 2021-10-08 ENCOUNTER — Encounter: Payer: Self-pay | Admitting: Urology

## 2021-10-08 ENCOUNTER — Other Ambulatory Visit: Payer: Self-pay

## 2021-10-08 VITALS — BP 158/91 | HR 92 | Ht 72.0 in | Wt 215.0 lb

## 2021-10-08 DIAGNOSIS — N529 Male erectile dysfunction, unspecified: Secondary | ICD-10-CM | POA: Diagnosis not present

## 2021-10-08 DIAGNOSIS — R6882 Decreased libido: Secondary | ICD-10-CM

## 2021-10-08 DIAGNOSIS — R350 Frequency of micturition: Secondary | ICD-10-CM | POA: Diagnosis not present

## 2021-10-08 NOTE — Progress Notes (Signed)
° °  10/08/21 9:09 AM   Ferdinand Lango 1981/05/24 [id number]  CC: Decreased libido, erectile dysfunction, urinary frequency  HPI: I saw Mr. Rohrman today for the above issues.  He reports about a year of decreased sex drive and libido, as well as some occasional trouble with erections.  The libido is more of an issue than the erections for him.  He also has urinary frequency during the day, nocturia 1 time overnight.  He drinks 3 glasses of wine in the evening most nights.  He has a 1/4 pack a day smoker.  No prior testosterone values to review, not on any medications, no family history of prostate cancer.  He denies any curvature with erections.  He has 1 child, and does not think he is interested in further biologic pregnancies.    PMH: Past Medical History:  Diagnosis Date   Chronic idiopathic thrombocytopenia (HCC)     Surgical History: Past Surgical History:  Procedure Laterality Date   HAND SURGERY     ORIF TOE FRACTURE Right 05/20/2016   Procedure: OPEN REDUCTION INTERNAL FIXATION (ORIF) RIGHT 2ND  METATARSAL (TOE) FRACTURE, OPEN REDUCTION INTERNAL FIXATION LISFRANC JOINT;  Surgeon: Eldred Manges, MD;  Location: [facility] OR;  Service: Orthopedics;  Laterality: Right;     Family History: Family History  Problem Relation Age of Onset   Hypertension Father    Heart disease Father    Cancer Maternal Uncle    Leukemia Maternal Uncle    Leukemia Cousin    Leukemia Cousin    Leukemia Cousin     Social History:  reports that he has been smoking. He has never been exposed to tobacco smoke. He has never used smokeless tobacco. He reports current alcohol use. No history on file for drug use.  Physical Exam: BP (!) 158/91    Pulse 92    Ht 6' (1.829 m)    Wt 215 lb (97.5 kg)    BMI 29.16 kg/m    Constitutional:  Alert and oriented, No acute distress. Cardiovascular: No clubbing, cyanosis, or edema. Respiratory: Normal respiratory effort, no increased work of breathing. GI: Abdomen is  soft, nontender, nondistended, no abdominal masses GU: Circumcised phallus with patent meatus, subtle hypospadias but meatus on the glans, testicles 25 cc and descended bilaterally without masses, vas deferens palpable.  Assessment & Plan:   41 year old male with 1 year of decreased libido, occasional ED, and mild urinary frequency.  We reviewed the AUA guidelines regarding work-up of low testosterone and ED, and we focused primarily on behavioral strategies today including decreased alcohol intake, smoking cessation, exercise, and weight loss.  We discussed a trial of Cialis, but he would like to hold off at this time and start with a testosterone.  We discussed possible options for replacement, and the impact on fertility if testosterone was low.  Testosterone today, call with results-if low will schedule follow-up with PA to discuss replacement options  Legrand Rams, MD 10/08/2021  [facility] [address], [address] [place], [place] [address] [phone]

## 2021-10-08 NOTE — Patient Instructions (Signed)
Testosterone Replacement Therapy Testosterone replacement therapy (TRT) treats men who have a low testosterone level. Testosterone is a male hormone that is produced in the testicles. It is responsible for typical male characteristics and for maintaining a man's sex drive and ability to get an erection. Testosterone also supports bone and muscle health. Low testosterone may not need to be treated. Your health care provider may recommend TRT if you have symptoms such as a low sex drive or erection problems, weak muscles or bones, or low energy. Types of TRT You and your health care provider will decide which form is best for you. TRT is available in the following forms: Topical gels, creams, lotions, or sprays. Do not let other people, especially women or children, come in contact with the skin where the testosterone was applied. Nasal gels. Patches. Pills. Injections into the muscle or under the skin. Long-acting pellets inserted under the skin. The amount of TRT you take and how long you take it is based on your condition. It is important to: Begin TRT with the lowest possible dosage. Stop TRT if your health care provider tells you to stop. Work with your health care provider so that you feel informed and comfortable with your decision. Tell a health care provider about: Any allergies you have. Any personal or family history of blood clots, breast cancer, or prostate cancer. If you have sleep apnea or have been told that you snore. All medicines you are taking, including vitamins, herbs, eye drops, creams, and over-the-counter medicines. Any surgeries you have had. Any other medical conditions you have. If you wish to have biological children someday. What are the benefits? Benefits of TRT vary but may include improved sexual function, muscle mass or strength, mood, or quality of life. What are the risks? Risks of TRT vary depending on your individual and medical history. Side effects can  be related to the type of TRT you choose. If you choose products that are used on the skin, you may have skin irritation. If you get injections, you may have redness and swelling at the injection site. Side effects that can happen with any form of TRT include: Lower sperm count. Acne. Swelling of your legs or feet, or tenderness in the chest or breast area. Sleep disturbances and mood swings. Enlarged prostate. Increase in your red blood count. It is unclear if testosterone increases the risk of some serious conditions. Talk with your health care provider about your risk for these conditions, including: Blood clots. Heart disease, such as stroke or heart attack. Prostate cancer. Risks of TRT may increase if you: Have had or are at risk for prostate cancer or breast cancer. Have had a previous stroke or heart attack. Have a high number of red blood cells. Have treated or untreated sleep apnea. Have a large prostate. The long-term safety of TRT is not known. Follow these instructions at home: Take over-the-counter and prescription medicines only as told by your health care provider. Lose weight if you are overweight. Ask your health care provider to help you start a healthy diet and exercise program to reach and maintain a healthy weight. Work with your health care provider to manage other medical conditions that may lower your testosterone. These include obesity, high blood pressure, high cholesterol, diabetes, liver disease, kidney disease, and sleep apnea. Do not use any testosterone replacement therapies that are not prescribed by your health care provider. Keep all follow-up visits. This is important. Where to find more information [place]:  www.urologyhealth.org Endocrine Society: www.hormone.org Contact a health care provider if: You have side effects from your TRT. You have pain or swelling in your legs. You have problems urinating. You have lumps or  changes in your breasts or armpits. Get help right away if: You have shortness of breath. You have chest pain. You have slurred speech. You have weakness or numbness in any part of your arms or legs. These symptoms may represent a serious problem that is an emergency. Do not wait to see if the symptoms will go away. Get medical help right away. Call your local emergency services (911 in the [place] Do not drive yourself to the hospital. Summary Testosterone replacement therapy (TRT) is used to treat men who have a low testosterone level. TRT should only be prescribed by and under the supervision of a health care provider. Tell a health care provider about any medical conditions you have. TRT may have side effects. Talk with your health care provider about all of the risks and benefits before you start therapy. This information is not intended to replace advice given to you by your health care provider. Make sure you discuss any questions you have with your health care provider. Document Revised: 06/04/2020 Document Reviewed: 06/04/2020 Elsevier Patient Education  2022 [employer]  Erectile Dysfunction Erectile dysfunction (ED) is the inability to get or keep an erection in order to have sexual intercourse. ED is considered a symptom of an underlying disorder and is not considered a disease. ED may include: Inability to get an erection. Lack of enough hardness of the erection to allow penetration. Loss of erection before sex is finished. What are the causes? This condition may be caused by: Physical causes, such as: Artery problems. This may include heart disease, high blood pressure, atherosclerosis, and diabetes. Hormonal problems, such as low testosterone. Obesity. Nerve problems. This may include back or pelvic injuries, multiple sclerosis, Parkinson's disease, spinal cord injury, and stroke. Certain medicines, such as: Pain relievers. Antidepressants. Blood pressure medicines  and water pills (diuretics). Cancer medicines. Antihistamines. Muscle relaxants. Lifestyle factors, such as: Use of drugs such as marijuana, cocaine, or opioids. Excessive use of alcohol. Smoking. Lack of physical activity or exercise. Psychological causes, such as: Anxiety or stress. Sadness or depression. Exhaustion. Fear about sexual performance. Guilt. What are the signs or symptoms? Symptoms of this condition include: Inability to get an erection. Lack of enough hardness of the erection to allow penetration. Loss of the erection before sex is finished. Sometimes having normal erections, but with frequent unsatisfactory episodes. Low sexual satisfaction in either partner due to erection problems. A curved penis occurring with erection. The curve may cause pain, or the penis may be too curved to allow for intercourse. Never having nighttime or morning erections. How is this diagnosed? This condition is often diagnosed by: Performing a physical exam to find other diseases or specific problems with the penis. Asking you detailed questions about the problem. Doing tests, such as: Blood tests to check for diabetes mellitus or high cholesterol, or to measure hormone levels. Other tests to check for underlying health conditions. An ultrasound exam to check for scarring. A test to check blood flow to the penis. Doing a sleep study at home to measure nighttime erections. How is this treated? This condition may be treated by: Medicines, such as: Medicine taken by mouth to help you achieve an erection (oral medicine). Hormone replacement therapy to replace low testosterone levels. Medicine that is injected into the penis. Your health  care provider may instruct you how to give yourself these injections at home. Medicine that is delivered with a short applicator tube. The tube is inserted into the opening at the tip of the penis, which is the opening of the urethra. A tiny pellet of  medicine is put in the urethra. The pellet dissolves and enhances erectile function. This is also called MUSE (medicated urethral system for erections) therapy. Vacuum pump. This is a pump with a ring on it. The pump and ring are placed on the penis and used to create pressure that helps the penis become erect. Penile implant surgery. In this procedure, you may receive: An inflatable implant. This consists of cylinders, a pump, and a reservoir. The cylinders can be inflated with a fluid that helps to create an erection, and they can be deflated after intercourse. A semi-rigid implant. This consists of two silicone rubber rods. The rods provide some rigidity. They are also flexible, so the penis can both curve downward in its normal position and become straight for sexual intercourse. Blood vessel surgery to improve blood flow to the penis. During this procedure, a blood vessel from a different part of the body is placed into the penis to allow blood to flow around (bypass) damaged or blocked blood vessels. Lifestyle changes, such as exercising more, losing weight, and quitting smoking. Follow these instructions at home: Medicines  Take over-the-counter and prescription medicines only as told by your health care provider. Do not increase the dosage without first discussing it with your health care provider. If you are using self-injections, do injections as directed by your health care provider. Make sure you avoid any veins that are on the surface of the penis. After giving an injection, apply pressure to the injection site for 5 minutes. Talk to your health care provider about how to prevent headaches while taking ED medicines. These medicines may cause a sudden headache due to the increase in blood flow in your body. General instructions Exercise regularly, as directed by your health care provider. Work with your health care provider to lose weight, if needed. Do not use any products that contain  nicotine or tobacco. These products include cigarettes, chewing tobacco, and vaping devices, such as e-cigarettes. If you need help quitting, ask your health care provider. Before using a vacuum pump, read the instructions that come with the pump and discuss any questions with your health care provider. Keep all follow-up visits. This is important. Contact a health care provider if: You feel nauseous. You are vomiting. You get sudden headaches while taking ED medicines. You have any concerns about your sexual health. Get help right away if: You are taking oral or injectable medicines and you have an erection that lasts longer than 4 hours. If your health care provider is unavailable, go to the nearest emergency room for evaluation. An erection that lasts much longer than 4 hours can result in permanent damage to your penis. You have severe pain in your groin or abdomen. You develop redness or severe swelling of your penis. You have redness spreading at your groin or lower abdomen. You are unable to urinate. You experience chest pain or a rapid heartbeat (palpitations) after taking oral medicines. These symptoms may represent a serious problem that is an emergency. Do not wait to see if the symptoms will go away. Get medical help right away. Call your local emergency services (911 in the [place] Do not drive yourself to the hospital. Summary Erectile dysfunction (ED) is the  inability to get or keep an erection during sexual intercourse. This condition is diagnosed based on a physical exam, your symptoms, and tests to determine the cause. Treatment varies depending on the cause and may include medicines, hormone therapy, surgery, or a vacuum pump. You may need follow-up visits to make sure that you are using your medicines or devices correctly. Get help right away if you are taking or injecting medicines and you have an erection that lasts longer than 4 hours. This information is not intended to  replace advice given to you by your health care provider. Make sure you discuss any questions you have with your health care provider. Document Revised: 11/06/2020 Document Reviewed: 11/06/2020 Elsevier Patient Education  2022 [employer].

## 2021-10-09 ENCOUNTER — Telehealth: Payer: Self-pay

## 2021-10-09 LAB — TESTOSTERONE: Testosterone: 415 ng/dL (ref 264–916)

## 2021-10-09 NOTE — Telephone Encounter (Signed)
-----   Message from Sondra Come, MD sent at 10/09/2021  8:21 AM EST ----- Good news, testosterone normal.  Recommend smoking cessation, cutting back on alcohol, continuing exercise, and improving sleep quality  Legrand Rams, MD 10/09/2021

## 2021-10-09 NOTE — Telephone Encounter (Signed)
Called pt informed her of the information below. Pt voiced understanding.  

## 2021-12-25 DIAGNOSIS — D485 Neoplasm of uncertain behavior of skin: Secondary | ICD-10-CM | POA: Diagnosis not present

## 2021-12-25 DIAGNOSIS — D225 Melanocytic nevi of trunk: Secondary | ICD-10-CM | POA: Diagnosis not present

## 2021-12-25 DIAGNOSIS — D2262 Melanocytic nevi of left upper limb, including shoulder: Secondary | ICD-10-CM | POA: Diagnosis not present

## 2021-12-25 DIAGNOSIS — L814 Other melanin hyperpigmentation: Secondary | ICD-10-CM | POA: Diagnosis not present

## 2021-12-25 DIAGNOSIS — D2322 Other benign neoplasm of skin of left ear and external auricular canal: Secondary | ICD-10-CM | POA: Diagnosis not present

## 2021-12-25 DIAGNOSIS — L538 Other specified erythematous conditions: Secondary | ICD-10-CM | POA: Diagnosis not present

## 2021-12-25 DIAGNOSIS — D2261 Melanocytic nevi of right upper limb, including shoulder: Secondary | ICD-10-CM | POA: Diagnosis not present

## 2021-12-25 DIAGNOSIS — B078 Other viral warts: Secondary | ICD-10-CM | POA: Diagnosis not present

## 2022-04-28 ENCOUNTER — Telehealth: Payer: Self-pay

## 2022-04-28 NOTE — Telephone Encounter (Signed)
Holding for Lauren. ?

## 2022-04-28 NOTE — Telephone Encounter (Signed)
The patient was not able to get his MR Arthrogram done on his right shoulder last year, due to being too busy. The pain has increased since then though (throbbing with occ sharp pains) - the pain is disturbing his sleep. He has decreased ROM with "crunching" in the shoulder with motion. He would like to have this test done now. He does have an appt later this month with Dr. August Saucer for his knee -- is willing to have the shoulder reevaluated then, if needed. The patient's cb [phone].  If going ahead with the test is ok, a new order will need to placed for this.

## 2022-05-05 NOTE — Telephone Encounter (Signed)
Ok for scan thx

## 2022-05-06 ENCOUNTER — Other Ambulatory Visit: Payer: Self-pay

## 2022-05-06 DIAGNOSIS — M25511 Pain in right shoulder: Secondary | ICD-10-CM

## 2022-05-20 ENCOUNTER — Ambulatory Visit (INDEPENDENT_AMBULATORY_CARE_PROVIDER_SITE_OTHER): Payer: BC Managed Care – PPO

## 2022-05-20 ENCOUNTER — Ambulatory Visit: Payer: BC Managed Care – PPO | Admitting: Orthopedic Surgery

## 2022-05-20 DIAGNOSIS — M25561 Pain in right knee: Secondary | ICD-10-CM

## 2022-05-21 ENCOUNTER — Ambulatory Visit
Admission: RE | Admit: 2022-05-21 | Discharge: 2022-05-21 | Disposition: A | Discharge status: Home / Self Care | Payer: BC Managed Care – PPO | Source: Ambulatory Visit | Attending: Orthopedic Surgery | Admitting: Orthopedic Surgery

## 2022-05-21 DIAGNOSIS — S46011A Strain of muscle(s) and tendon(s) of the rotator cuff of right shoulder, initial encounter: Secondary | ICD-10-CM | POA: Diagnosis not present

## 2022-05-21 DIAGNOSIS — M25511 Pain in right shoulder: Secondary | ICD-10-CM

## 2022-05-21 MED ORDER — IOPAMIDOL (ISOVUE-M 200) INJECTION 41%
12.0000 mL | Freq: Once | INTRAMUSCULAR | Status: AC
  Administered 2022-05-21: 12 mL via INTRA_ARTICULAR

## 2022-05-22 ENCOUNTER — Encounter: Payer: Self-pay | Admitting: Orthopedic Surgery

## 2022-05-22 NOTE — Progress Notes (Signed)
Office Visit Note   Patient: Christian Roberson           Date of Birth: May 07, 1981           MRN: [id number] Visit Date: 05/20/2022 Requested by: Corky Downs, MD [address] [place],  [place] [address] PCP: Corky Downs, MD  Subjective: Chief Complaint  Patient presents with   Right Knee - Pain    HPI: Christian Roberson is a 41 y.o. male who presents to the office describing right knee pain of 2 months duration without any injury.  He feels like he may have twisted it about 2 months ago but does not recall a specific event.  He describes locking popping and stiffness.  Reports a new "crunching" feeling a month ago when he was unable to weight-bear.  Does report swelling and effusion in the knee with increased activity.  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  He has good and bad days.  Stairs are difficult.  He likes to get to the gym 5 days a week but he has not been able to do any leg workout because of the right knee.  Reports primarily medial sided pain.  Takes ibuprofen for the problem without much relief.              ROS: All systems reviewed are negative as they relate to the chief complaint within the history of present illness.  Patient denies fevers or chills.  Assessment & Plan: Visit Diagnoses:  1. Right knee pain, unspecified chronicity     Plan: Impression is right knee medial meniscal tear.  Patient has an effusion and medial joint line tenderness.  Has essentially failed a conservative treatment plan including exercises.  Plan MRI scan right knee to evaluate medial meniscal tear.  MRI of the shoulders are tomorrow.  We will see him back after those studies.  The shoulder is bothering him more than the knee at this point.  Follow-Up Instructions: No follow-ups on file.   Orders:  Orders Placed This Encounter  Procedures   XR KNEE 3 VIEW RIGHT   MR Knee Right w/o contrast   No orders of the defined types were placed in this  encounter.     Procedures: No procedures performed   Clinical Data: No additional findings.  Objective: Vital Signs: There were no vitals taken for this visit.  Physical Exam:  Constitutional: Patient appears well-developed HEENT:  Head: Normocephalic Eyes:EOM are normal Neck: Normal range of motion Cardiovascular: Normal rate Pulmonary/chest: Effort normal Neurologic: Patient is alert Skin: Skin is warm Psychiatric: Patient has normal mood and affect  Ortho Exam: Ortho exam demonstrates trace effusion in the right knee with intact extensor mechanism.  Collateral and cruciate ligaments are stable.  Patient has medial joint line tenderness.  No masses lymphadenopathy or skin changes noted in that knee region.  No groin pain on the right with internal or external rotation of the right knee.  McMurray compression testing is positive for medial compartment pathology.  Specialty Comments:  No specialty comments available.  Imaging: Arthrogram  Result Date: 05/21/2022 CLINICAL DATA:  Right shoulder pain. FLUOROSCOPY TIME:  Radiation Exposure Index (as provided by the fluoroscopic device): 1.0 mGy Kerma PROCEDURE: The risks and benefits of the procedure were discussed with the patient, and written informed consent was obtained. The patient stated no history of allergy to contrast media. A formal timeout procedure was performed with the patient according  to departmental protocol. The patient was placed supine on the fluoroscopy table and the right glenohumeral joint was identified under fluoroscopy. The skin overlying the right glenohumeral joint was subsequently cleaned with Betadine and a sterile drape was placed over the area of interest. 2 ml 1% Lidocaine was used to anesthetize the skin around the needle insertion site. A 22 gauge spinal needle was inserted into the right glenohumeral joint under fluoroscopy. 12 ml of gadolinium mixture (0.1 ml of Multihance mixed with 15 ml of  Isovue-M 200 contrast and 5 ml of sterile saline) were injected into the right glenohumeral joint. The needle was removed and hemostasis was achieved. The patient was subsequently transferred to MRI for imaging. IMPRESSION: Technically successful right shoulder injection for MRI. Electronically Signed   By: Titus Dubin M.D.   On: 05/21/2022 16:14     PMFS History: Patient Active Problem List   Diagnosis Date Noted   Rotator cuff arthropathy of right shoulder 02/11/2021   Snoring 02/11/2021   Smoker 02/11/2021   Dislocation of tarsometatarsal joint of right foot 09/18/2016   Closed dislocation of tarsometatarsal joint of right foot 07/07/2016   Crush injury of right foot 05/18/2016   Past Medical History:  Diagnosis Date   Chronic idiopathic thrombocytopenia (HCC)     Family History  Problem Relation Age of Onset   Hypertension Father    Heart disease Father    Cancer Maternal Uncle    Leukemia Maternal Uncle    Leukemia Cousin    Leukemia Cousin    Leukemia Cousin     Past Surgical History:  Procedure Laterality Date   HAND SURGERY     ORIF TOE FRACTURE Right 05/20/2016   Procedure: OPEN REDUCTION INTERNAL FIXATION (ORIF) RIGHT 2ND  METATARSAL (TOE) FRACTURE, OPEN REDUCTION INTERNAL FIXATION LISFRANC JOINT;  Surgeon: Marybelle Killings, MD;  Location: [facility];  Service: Orthopedics;  Laterality: Right;   Social History   Occupational History   Not on file  Tobacco Use   Smoking status: Some Days    Passive exposure: Never   Smokeless tobacco: Never  Substance and Sexual Activity   Alcohol use: Yes   Drug use: Not on file   Sexual activity: Not on file

## 2022-06-05 ENCOUNTER — Ambulatory Visit: Payer: BC Managed Care – PPO | Admitting: Surgical

## 2022-06-05 DIAGNOSIS — M75101 Unspecified rotator cuff tear or rupture of right shoulder, not specified as traumatic: Secondary | ICD-10-CM

## 2022-06-05 DIAGNOSIS — M7521 Bicipital tendinitis, right shoulder: Secondary | ICD-10-CM | POA: Diagnosis not present

## 2022-06-07 ENCOUNTER — Encounter: Payer: Self-pay | Admitting: Orthopedic Surgery

## 2022-06-07 NOTE — Progress Notes (Signed)
Office Visit Note   Patient: Christian Roberson           Date of Birth: Jan 04, 1981           MRN: [id number] Visit Date: 06/05/2022 Requested by: Cletis Athens, MD [address] [place],  [place] [id number] PCP: Cletis Athens, MD  Subjective: Chief Complaint  Patient presents with   Other     Scan review    HPI: Christian Roberson is a 41 y.o. male who presents to the office for MRI review. Patient denies any changes in symptoms.  Continues to complain mainly of anterior right shoulder pain that radiates to the bicep.  He has difficulty lifting his arm at times.  No neck pain.  No history of prior neck or shoulder surgery.  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  Enjoys playing golf and working out at [employer] in his free time.  Lives at home with his wife.  MRI results revealed: MR SHOULDER RIGHT W CONTRAST  Result Date: 05/25/2022 CLINICAL DATA:  Chronic right shoulder pain and weakness. EXAM: MRI OF THE RIGHT SHOULDER WITH CONTRAST TECHNIQUE: Multiplanar, multisequence MR imaging of the right shoulder was performed following the administration of intra-articular contrast. CONTRAST:  See Injection Documentation. COMPARISON:  Right shoulder x-rays dated February 26, 2021. FINDINGS: Rotator cuff: Full-thickness, partial width tear of the anterior supraspinatus tendon at the insertion, measuring 1.2 cm in AP dimension with 1.5 cm retraction. Severe thinning of the posterior supraspinatus tendon with high-grade partial-thickness articular surface tear. The infraspinatus, teres minor, and subscapularis tendons are intact. Muscles:  No focal muscular atrophy or edema. Biceps long head: Nonvisualization of the intra-articular biceps tendon, consistent with prior complete tear. Acromioclavicular Joint: Normal acromioclavicular joint. Large amount of contrast in the subacromial/subdeltoid bursa.  Glenohumeral Joint: Distended with intra-articular contrast. No chondral defect. Labrum:  Blunted superior labrum.  The labrum is otherwise intact. Bones: No acute fracture or dislocation. No suspicious bone lesion. Other: None. IMPRESSION: 1. Full-thickness, partial width tear of the anterior supraspinatus tendon at the insertion. Severe thinning of the posterior supraspinatus tendon with high-grade partial-thickness articular surface tear. No muscle atrophy. 2. 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 Electronically Signed   By: Titus Dubin M.D.   On: 05/25/2022 15:11                 ROS: All systems reviewed are negative as they relate to the chief complaint within the history of present illness.  Patient denies fevers or chills.  Assessment & Plan: Visit Diagnoses:  1. Tear of right supraspinatus tendon   2. Biceps tendinitis of right shoulder     Plan: Christian Roberson is a 41 y.o. male who presents to the office for evaluation of right shoulder pain.  MRI of the right shoulder reviewed today demonstrating full-thickness tear of the anterior supraspinatus with about 1.5 cm of retraction.  There is some accompanying thinning of the posterior supraspinatus that is noted as well.  He has exam findings with supraspinatus weakness relative to the contralateral shoulder as well as tenderness over the bicipital groove and positive O'Brien sign concerning for bicep tendinitis.  Discussed the options available to patient and with his hobbies of lifting weights and playing golf as well as his young age, recommended surgical intervention to repair the torn rotator cuff.  Discussed  the risks and benefits of the procedure including but not limited to the risk of nerve/blood vessel damage, shoulder stiffness, need for revision surgery in the future, infection, medical complication from surgery.  After discussion, patient would like to talk this over with his wife.  Debbie's card was given to  him and he will call her if he would like to schedule surgery.  Procedure would be right shoulder arthroscopy with bicep tendon relief and subsequent bicep tenodesis and rotator cuff tear repair through mini open incision.  With his physical lifestyle and line of work, he would likely be out of full lifting until at least 4 months out from procedure.  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  No major medical problems but he does smoke and he understands there is a increased risk of infection/wound complications for people who smoke.  Follow-Up Instructions: No follow-ups on file.   Orders:  No orders of the defined types were placed in this encounter.  No orders of the defined types were placed in this encounter.     Procedures: No procedures performed   Clinical Data: No additional findings.  Objective: Vital Signs: There were no vitals taken for this visit.  Physical Exam:  Constitutional: Patient appears well-developed HEENT:  Head: Normocephalic Eyes:EOM are normal Neck: Normal range of motion Cardiovascular: Normal rate Pulmonary/chest: Effort normal Neurologic: Patient is alert Skin: Skin is warm Psychiatric: Patient has normal mood and affect  Ortho Exam: Ortho exam demonstrates right shoulder with 40 degrees external rotation, 90 degrees abduction, 150 degrees forward flexion.  This compared with the left shoulder with 40 degrees X rotation, 120 degrees abduction, 170 degrees forward flexion.  He has active range of motion equivalent to passive range of motion.  No tenderness over the [facility] joint.  Moderate tenderness over the bicipital groove.  No deformity noted.  No cellulitis or skin changes noted.  No tenderness over the axial cervical spine.  5/5 motor strength of bilateral grip strength, finger abduction, pronation/supination, bicep, tricep, deltoid.  Axillary nerve is intact with deltoid firing.  5/5 interest Natus and subscapularis  strength.  5 -/5 supraspinatus drinks of the right shoulder very 5/5 in the left.  He does have reproduction of pain with stressing the supraspinatus on exam today.  Negative Hornblower sign.  Negative belly press test.  Negative external rotation lag sign.  Specialty Comments:  No specialty comments available.  Imaging: No results found.   PMFS History: Patient Active Problem List   Diagnosis Date Noted   Rotator cuff arthropathy of right shoulder 02/11/2021   Snoring 02/11/2021   Smoker 02/11/2021   Dislocation of tarsometatarsal joint of right foot 09/18/2016   Closed dislocation of tarsometatarsal joint of right foot 07/07/2016   Crush injury of right foot 05/18/2016   Past Medical History:  Diagnosis Date   Chronic idiopathic thrombocytopenia (HCC)     Family History  Problem Relation Age of Onset   Hypertension Father    Heart disease Father    Cancer Maternal Uncle    Leukemia Maternal Uncle    Leukemia Cousin    Leukemia Cousin    Leukemia Cousin     Past Surgical History:  Procedure Laterality Date   HAND SURGERY     ORIF TOE FRACTURE Right 05/20/2016   Procedure: OPEN REDUCTION INTERNAL FIXATION (ORIF) RIGHT 2ND  METATARSAL (TOE) FRACTURE, OPEN REDUCTION INTERNAL FIXATION LISFRANC JOINT;  Surgeon: Loraine Leriche  Becky Sax, MD;  Location: [facility] OR;  Service: Orthopedics;  Laterality: Right;   Social History   Occupational History   Not on file  Tobacco Use   Smoking status: Some Days    Passive exposure: Never   Smokeless tobacco: Never  Substance and Sexual Activity   Alcohol use: Yes   Drug use: Not on file   Sexual activity: Not on file

## 2022-07-09 ENCOUNTER — Other Ambulatory Visit: Payer: Self-pay

## 2022-07-09 NOTE — Progress Notes (Signed)
Surgical Instructions    Your procedure is scheduled on Thursday, December 14.  Report to [facility] Main Entrance "A" at 9:35 A.M., then check in with the Admitting office.  Call this number if you have problems the morning of surgery:  [phone]   If you have any questions prior to your surgery date call [phone]: Open Monday-Friday 8am-4pm If you experience any cold or flu symptoms such as cough, fever, chills, shortness of breath, etc. between now and your scheduled surgery, please notify us at the above number     Remember:  Do not eat after midnight the night before your surgery  You may drink clear liquids until 8:30AM the morning of your surgery.   Clear liquids allowed are: Water, Non-Citrus Juices (without pulp), Carbonated Beverages, Clear Tea, Black Coffee ONLY (NO MILK, CREAM OR POWDERED CREAMER of any kind), and Gatorade  Patient Instructions  The night before surgery:  No food after midnight. ONLY clear liquids after midnight  The day of surgery (if you do NOT have diabetes):  Drink ONE (1) Pre-Surgery Clear Ensure by 8:30AM the morning of surgery. Drink in one sitting. Do not sip.  This drink was given to you during your hospital  pre-op appointment visit.  Nothing else to drink after completing the  Pre-Surgery Clear Ensure.      Take these medicines the morning of surgery with A SIP OF WATER: NONE   As of today, STOP taking any Aspirin (unless otherwise instructed by your surgeon) Aleve, Naproxen, Ibuprofen, Motrin, Advil, Goody's, BC's, all herbal medications, fish oil, and all vitamins.             [facility] is not responsible for any belongings or valuables.    Do NOT Smoke (Tobacco/Vaping)  24 hours prior to your procedure  If you use a CPAP at night, you may bring your mask for your overnight stay.   Contacts, glasses, hearing aids, dentures or partials may not be worn into surgery, please bring cases for these belongings   For patients  admitted to the hospital, discharge time will be determined by your treatment team.   Patients discharged the day of surgery will not be allowed to drive home, and someone needs to stay with them for 24 hours.   SURGICAL WAITING ROOM VISITATION Patients having surgery or a procedure may have no more than 2 support people in the waiting area - these visitors may rotate.   Children under the age of 42 must have an adult with them who is not the patient. If the patient needs to stay at the hospital during part of their recovery, the visitor guidelines for inpatient rooms apply. Pre-op nurse will coordinate an appropriate time for 1 support person to accompany patient in pre-op.  This support person may not rotate.   Please refer to [email or web address] for the visitor guidelines for Inpatients (after your surgery is over and you are in a regular room).    Special instructions:    Oral Hygiene is also important to reduce your risk of infection.  Remember - BRUSH YOUR TEETH THE MORNING OF SURGERY WITH YOUR REGULAR TOOTHPASTE   [facility]- Preparing For Surgery  Before surgery, you can play an important role. Because skin is not sterile, your skin needs to be as free of germs as possible. You can reduce the number of germs on your skin by washing with CHG (chlorahexidine gluconate) Soap before surgery.  CHG is an antiseptic cleaner which kills germs and  bonds with the skin to continue killing germs even after washing.     Please do not use if you have an allergy to CHG or antibacterial soaps. If your skin becomes reddened/irritated stop using the CHG.  Do not shave (including legs and underarms) for at least 48 hours prior to first CHG shower. It is OK to shave your face.  Please follow these instructions carefully.     Shower the NIGHT BEFORE SURGERY and the MORNING OF SURGERY with CHG Soap.   If you chose to wash your hair, wash your hair  first as usual with your normal shampoo. After you shampoo, rinse your hair and body thoroughly to remove the shampoo.  Then [employer] and genitals (private parts) with your normal soap and rinse thoroughly to remove soap.  After that Use CHG Soap as you would any other liquid soap. You can apply CHG directly to the skin and wash gently with a scrungie or a clean washcloth.   Apply the CHG Soap to your body ONLY FROM THE NECK DOWN.  Do not use on open wounds or open sores. Avoid contact with your eyes, ears, mouth and genitals (private parts). Wash Face and genitals (private parts)  with your normal soap.   Wash thoroughly, paying special attention to the area where your surgery will be performed.  Thoroughly rinse your body with warm water from the neck down.  DO NOT shower/wash with your normal soap after using and rinsing off the CHG Soap.  Pat yourself dry with a CLEAN TOWEL.  Wear CLEAN PAJAMAS to bed the night before surgery  Place CLEAN SHEETS on your bed the night before your surgery  DO NOT SLEEP WITH PETS.   Day of Surgery:  Take a shower with CHG soap. Wear Clean/Comfortable clothing the morning of surgery Do not wear jewelry or makeup. Do not wear lotions, powders, perfumes/cologne or deodorant. Do not shave 48 hours prior to surgery.  Men may shave face and neck. Do not bring valuables to the hospital. Do not wear nail polish, gel polish, artificial nails, or any other type of covering on natural nails (fingers and toes) If you have artificial nails or gel coating that need to be removed by a nail salon, please have this removed prior to surgery. Artificial nails or gel coating may interfere with anesthesia's ability to adequately monitor your vital signs.  Remember to brush your teeth WITH YOUR REGULAR TOOTHPASTE.    If you received a COVID test during your pre-op visit, it is requested that you wear a mask when out in public, stay away from anyone that may not be  feeling well, and notify your surgeon if you develop symptoms. If you have been in contact with anyone that has tested positive in the last 10 days, please notify your surgeon.    Please read over the following fact sheets that you were given.

## 2022-07-10 ENCOUNTER — Encounter (HOSPITAL_COMMUNITY)
Admission: RE | Admit: 2022-07-10 | Discharge: 2022-07-10 | Disposition: A | Discharge status: Home / Self Care | Payer: BC Managed Care – PPO | Source: Ambulatory Visit | Attending: Orthopedic Surgery | Admitting: Orthopedic Surgery

## 2022-07-10 ENCOUNTER — Telehealth: Payer: Self-pay | Admitting: Orthopedic Surgery

## 2022-07-10 ENCOUNTER — Encounter (HOSPITAL_COMMUNITY): Payer: Self-pay

## 2022-07-10 ENCOUNTER — Other Ambulatory Visit: Payer: Self-pay

## 2022-07-10 DIAGNOSIS — Z01818 Encounter for other preprocedural examination: Secondary | ICD-10-CM | POA: Diagnosis not present

## 2022-07-10 LAB — CBC
HCT: 42.1 % (ref 39.0–52.0)
Hemoglobin: 15.3 g/dL (ref 13.0–17.0)
MCH: 37.2 pg — ABNORMAL HIGH (ref 26.0–34.0)
MCHC: 36.3 g/dL — ABNORMAL HIGH (ref 30.0–36.0)
MCV: 102.4 fL — ABNORMAL HIGH (ref 80.0–100.0)
Platelets: 58 10*3/uL — ABNORMAL LOW (ref 150–400)
RBC: 4.11 MIL/uL — ABNORMAL LOW (ref 4.22–5.81)
RDW: 11.6 % (ref 11.5–15.5)
WBC: 3.9 10*3/uL — ABNORMAL LOW (ref 4.0–10.5)
nRBC: 0 % (ref 0.0–0.2)

## 2022-07-10 LAB — BASIC METABOLIC PANEL
Anion gap: 10 (ref 5–15)
BUN: 17 mg/dL (ref 6–20)
CO2: 23 mmol/L (ref 22–32)
Calcium: 9.6 mg/dL (ref 8.9–10.3)
Chloride: 104 mmol/L (ref 98–111)
Creatinine, Ser: 0.91 mg/dL (ref 0.61–1.24)
GFR, Estimated: >60 mL/min (ref >60)
Glucose, Bld: 86 mg/dL (ref 70–99)
Potassium: 4.2 mmol/L (ref 3.5–5.1)
Sodium: 137 mmol/L (ref 135–145)

## 2022-07-10 NOTE — Progress Notes (Signed)
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 Patient agrees to go over the instructions while at home for a better understanding. Patient also instructed to wear a mask when out in public prior to surgery. The opportunity to ask questions was provided.

## 2022-07-10 NOTE — Telephone Encounter (Signed)
FYIHarriett Sine with pre-admissions [phone]) calling in reference to patient's platelet results:  58.  Patient has a diagnosis of Chronic idiopathic thrombocytopenia   Patient is scheduled 08-06-22 at [facility] for right shoulder arthroscopy, debridement, mini open rotator cuff tear repair, biceps tenodsis.

## 2022-07-13 NOTE — Telephone Encounter (Signed)
Looks like this is not any significantly different than his platelet count 6 years ago.  He has known history of idiopathic thrombocytopenia.  Do you want medical clearance prior to surgery in December?

## 2022-07-13 NOTE — Progress Notes (Addendum)
Anesthesia Chart Review:  Case: [id number] Date/Time: 08/06/22 1120   Procedure: RIGHT SHOULDER ARTHROSCOPY, DEBRIDEMENT, MINI OPEN ROTATOR CUFF TEAR REPAIR, BICEPS TENODESIS (Right)   Anesthesia type: General   Pre-op diagnosis: right shoulder rotator cuff tear, biceps tendinitis   [facility] / [facility]   Surgeons: Cammy Copa, MD       DISCUSSION: Patient is a 41 year old male scheduled for the above procedure.  History includes smoking, chronic ITP, tonsillectomy, hand surgery, right foot crush injury (s/p ORIF 2nd-4th toe fractures 05/20/16).   He was diagnosed with ITP prior to 2016. Notes suggest work-up was done at [facility] (hematology visits dating back to 2001 and 2005, but records not viewable in Care Everywhere). Labs in [facility] since 04/19/15 show a PLT range of 41-61K. 41K was on 04/19/15 and 61K on 02/11/21. 07/01/11 CBC results show H/H 15.3/42.1, PLT 58K. Last hematology visit noted was in 2016 with Dr. Orlie Dakin at [facility]. PLT count then was 41-51K and note mentioned consideration of steroids for biopsy. He later had foot surgery on 05/20/16 for crush injury. PLT count then was 51K--I did not see that steroids were given per review of medications given during that admission.    I discussed with anesthesiologist St[place]us, Earl Lites, DO. Would recommend hematology provide preoperative recommendations, as previously pre-procedure platelets was considered. I have sent communication regarding this to Dr. August Saucer and his APP and scheduler. Chart will be left for follow-up.   ADDENDUM 07/27/22 12:02 PM: Mr. Tappan was evaluated by Dr. Orlie Dakin on 07/24/22 for chronic ITP follow-up and preoperative recommendations. He wrote, "Chronic ITP: Patient's platelet count has ranged from 41-61 since at least August 2016.  Full hematologic work-up prior that was completed at [facility] and reported as negative.  Patient has a history of antiplatelet antibodies, but these can be transient  in nature and this was repeated today.  Results are pending at time of dictation.  Prior to patient's surgery, okay to try a course of steroids (1 mg/kg x 7 days) to see if this improves his platelet count.  If no improvement, okay to give platelet transfusion prior to and during procedure.  No follow-up has been scheduled."  Dr. Orlie Dakin ordered a repeat CBC and platelet antibody profile. The platelet antibody profile was negative (previously + IIB/IIIA and IB/IX Antibodies on 04/24/15). PLT count on 07/24/22 was 53.   Recommendations communicated with orthopedic PA Magnant. If prednisone not prescribed by Dr. Orlie Dakin then he is planning to order for patient with plans for repeat CBC on the day of surgery. Will also plan to get a PT/INR and T&S given ITP/thrombocytopenia and potential consideration of nerve block. Decision regarding perioperative platelets will depend on day of surgery CBC results. (ADDENDUM 07/27/22 2:50 PM: Staff contact Mr. Hitchman to inquire about getting additional labs as mentioned prior to the day of surgery, but he declined indicating he had already had repeat labs at hematology visit. He will be asked to arrive 3 hours early instead of 2 hours early given need for repeat labs.)    VS: BP (!) 152/97   Pulse 96   Temp 36.7 C   Resp 17   Ht 6' (1.829 m)   Wt 93.8 kg   SpO2 100%   BMI 28.03 kg/m    PROVIDERS: Corky Downs, MD is PCP. Last visit 02/17/21. [facility]) Legrand Rams, MD is urologist - Last hematology visit seen is for 04/28/15 with Gerarda Fraction,  MD for inguinal adenopathy with known chronic ITP.  In regards to lymphadenopathy, if PET scan negative then nothing further, but if positive would proceed with lymph node biopsy. Per note, "Patient states he had a full workup at [facility] and was told he had ITP. Patient was found to have platelet antibodies, but otherwise the remainder of his laboratory work is either negative or  within normal limits." If he required biopsy, he would be considered for course of steroids to improve platelet count prior to biopsy. PLT count was 41-51K at that time. It's not clear to me whether he ever had a lymph node biopsy.    LABS: Preoperative labs noted. See DISCUSSION. (all labs ordered are listed, but only abnormal results are displayed)  Labs Reviewed  CBC - Abnormal; Notable for the following components:      Result Value   WBC 3.9 (*)    RBC 4.11 (*)    MCV 102.4 (*)    MCH 37.2 (*)    MCHC 36.3 (*)    Platelets 58 (*)    All other components within normal limits  BASIC METABOLIC PANEL     IMAGES: MRI Right Shoulder 05/21/22: IMPRESSION: 1. Full-thickness, partial width tear of the anterior supraspinatus tendon at the insertion. Severe thinning of the posterior supraspinatus tendon with high-grade partial-thickness articular surface tear. No muscle atrophy. 2. 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    EKG: Last EKG seen is for 05/18/16: Sinus rhythm RSR' in V1 or V2, probably normal variant ST elev, probable normal early repol pattern Abnormal ekg Confirmed by Gerhard Munch MD [id number]) on 05/19/2016 1:34:09 PM   CV: N/A   Past Medical History:  Diagnosis Date   Chronic idiopathic thrombocytopenia (HCC)     Past Surgical History:  Procedure Laterality Date   HAND SURGERY     ORIF TOE FRACTURE Right 05/20/2016   Procedure: OPEN REDUCTION INTERNAL FIXATION (ORIF) RIGHT 2ND  METATARSAL (TOE) FRACTURE, OPEN REDUCTION INTERNAL FIXATION LISFRANC JOINT;  Surgeon: Eldred Manges, MD;  Location: [facility] OR;  Service: Orthopedics;  Laterality: Right;   TONSILLECTOMY      MEDICATIONS: No current outpatient medications on file.   No current facility-administered medications for this encounter.    Shonna Chock, PA-C Surgical Short Stay/Anesthesiology [facility] Phone [phone] [facility] Phone [phone] 07/14/2022 10:58 AM

## 2022-07-14 ENCOUNTER — Other Ambulatory Visit: Payer: Self-pay

## 2022-07-14 DIAGNOSIS — D696 Thrombocytopenia, unspecified: Secondary | ICD-10-CM

## 2022-07-14 NOTE — Telephone Encounter (Signed)
Just needs platelet transfusion to start 30 min before procedure - pls check with anesthesia as to teir requirement or remind me today and we can ask

## 2022-07-14 NOTE — Telephone Encounter (Signed)
I spoke with Dr Orlie Dakin.  He will follow-up with him prior to surgery

## 2022-07-24 ENCOUNTER — Encounter: Payer: Self-pay | Admitting: Oncology

## 2022-07-24 ENCOUNTER — Inpatient Hospital Stay: Payer: BC Managed Care – PPO | Attending: Oncology

## 2022-07-24 ENCOUNTER — Inpatient Hospital Stay: Payer: BC Managed Care – PPO | Admitting: Oncology

## 2022-07-24 VITALS — BP 137/96 | HR 76 | Resp 18 | Ht 72.0 in | Wt 207.0 lb

## 2022-07-24 DIAGNOSIS — F17210 Nicotine dependence, cigarettes, uncomplicated: Secondary | ICD-10-CM | POA: Insufficient documentation

## 2022-07-24 DIAGNOSIS — D693 Immune thrombocytopenic purpura: Secondary | ICD-10-CM

## 2022-07-24 LAB — CBC
HCT: 44.4 % (ref 39.0–52.0)
Hemoglobin: 15.4 g/dL (ref 13.0–17.0)
MCH: 35.5 pg — ABNORMAL HIGH (ref 26.0–34.0)
MCHC: 34.7 g/dL (ref 30.0–36.0)
MCV: 102.3 fL — ABNORMAL HIGH (ref 80.0–100.0)
Platelets: 53 10*3/uL — ABNORMAL LOW (ref 150–400)
RBC: 4.34 MIL/uL (ref 4.22–5.81)
RDW: 11.6 % (ref 11.5–15.5)
WBC: 3.8 10*3/uL — ABNORMAL LOW (ref 4.0–10.5)
nRBC: 0 % (ref 0.0–0.2)

## 2022-07-24 LAB — IRON AND TIBC
Iron: 99 ug/dL (ref 45–182)
Saturation Ratios: 24 % (ref 17.9–39.5)
TIBC: 407 ug/dL (ref 250–450)
UIBC: 308 ug/dL

## 2022-07-24 LAB — FERRITIN: Ferritin: 442 ng/mL — ABNORMAL HIGH (ref 24–336)

## 2022-07-24 NOTE — Progress Notes (Signed)
[facility]  [phone] [phone]  ID: Christian Roberson OB: 1981/05/06  MR#: [id number]  OEU#:[id number]  Patient Care Team: Patient, No Pcp Per as PCP - General (General Practice)  CHIEF COMPLAINT: Chronic ITP.  INTERVAL HISTORY: Patient is a 41 year old male who was last evaluated in clinic in 2016.  He is referred back for further evaluation prior to right shoulder surgery.  Currently feels well and is asymptomatic.  He denies any easy bleeding or bruising.  He has no neurologic complaints.  He denies any recent fevers or illnesses. He has a good appetite and denies weight loss.  He has no chest pain, shortness of breath, cough, or hemoptysis.  He denies any nausea, vomiting, constipation, or diarrhea.  He has no urinary complaints.  Patient feels at his baseline offers no specific complaints today.  REVIEW OF SYSTEMS:   Review of Systems  Constitutional: Negative.  Negative for fever, malaise/fatigue and weight loss.  Respiratory: Negative.  Negative for cough, hemoptysis and shortness of breath.   Cardiovascular: Negative.  Negative for chest pain and leg swelling.  Gastrointestinal: Negative.  Negative for abdominal pain.  Genitourinary: Negative.  Negative for dysuria.  Musculoskeletal: Negative.  Negative for back pain.  Skin: Negative.  Negative for rash.  Neurological: Negative.  Negative for dizziness, weakness and headaches.  Endo/Heme/Allergies:  Does not bruise/bleed easily.  Psychiatric/Behavioral: Negative.  The patient is not nervous/anxious.     As per HPI. Otherwise, a complete review of systems is negative.  PAST MEDICAL HISTORY: Past Medical History:  Diagnosis Date   Chronic idiopathic thrombocytopenia (HCC)     PAST SURGICAL HISTORY: Past Surgical History:  Procedure Laterality Date   HAND SURGERY     ORIF TOE FRACTURE Right 05/20/2016   Procedure: OPEN REDUCTION INTERNAL FIXATION (ORIF) RIGHT 2ND  METATARSAL (TOE)  FRACTURE, OPEN REDUCTION INTERNAL FIXATION LISFRANC JOINT;  Surgeon: Eldred Manges, MD;  Location: [facility] OR;  Service: Orthopedics;  Laterality: Right;   TONSILLECTOMY      FAMILY HISTORY: Family History  Problem Relation Age of Onset   Hypertension Father    Heart disease Father    Cancer Maternal Uncle    Leukemia Maternal Uncle    Leukemia Cousin    Leukemia Cousin    Leukemia Cousin     ADVANCED DIRECTIVES (Y/N):  N  HEALTH MAINTENANCE: Social History   Tobacco Use   Smoking status: Some Days    Passive exposure: Never   Smokeless tobacco: Never  Vaping Use   Vaping Use: Some days  Substance Use Topics   Alcohol use: Yes     Colonoscopy:  PAP:  Bone density:  Lipid panel:  No Known Allergies  No current outpatient medications on file.   No current facility-administered medications for this visit.    OBJECTIVE: Vitals:   07/24/22 1011  BP: (!) 137/96  Pulse: 76  Resp: 18  SpO2: 99%     Body mass index is 28.07 kg/m.    ECOG FS:0 - Asymptomatic  General: Well-developed, well-nourished, no acute distress. Eyes: Pink conjunctiva, anicteric sclera. HEENT: Normocephalic, moist mucous membranes. Lungs: No audible wheezing or coughing. Heart: Regular rate and rhythm. Abdomen: Soft, nontender, no obvious distention. Musculoskeletal: No edema, cyanosis, or clubbing. Neuro: Alert, answering all questions appropriately. Cranial nerves grossly intact. Skin: No rashes or petechiae noted. Psych: Normal affect. Lymphatics: No cervical, calvicular, axillary or inguinal LAD.   LAB RESULTS:  Lab Results  Component Value Date  NA 137 07/10/2022   K 4.2 07/10/2022   CL 104 07/10/2022   CO2 23 07/10/2022   GLUCOSE 86 07/10/2022   BUN 17 07/10/2022   CREATININE 0.91 07/10/2022   CALCIUM 9.6 07/10/2022   PROT 7.6 02/11/2021   ALBUMIN 4.5 04/19/2015   AST 42 (H) 02/11/2021   ALT 39 02/11/2021   ALKPHOS 68 04/19/2015   BILITOT 0.5 02/11/2021   GFRNONAA >60  07/10/2022   GFRAA 104 02/11/2021    Lab Results  Component Value Date   WBC 3.8 (L) 07/24/2022   NEUTROABS 2,458 02/11/2021   HGB 15.4 07/24/2022   HCT 44.4 07/24/2022   MCV 102.3 (H) 07/24/2022   PLT 53 (L) 07/24/2022     STUDIES: No results found.  ASSESSMENT: Chronic ITP.  PLAN:    Chronic ITP: Patient's platelet count has ranged from 41-61 since at least August 2016.  Full hematologic work-up prior that was completed at [facility] and reported as negative.  Patient has a history of antiplatelet antibodies, but these can be transient in nature and this was repeated today.  Results are pending at time of dictation.  Prior to patient's surgery, okay to try a course of steroids (1 mg/kg x 7 days) to see if this improves his platelet count.  If no improvement, okay to give platelet transfusion prior to and during procedure.  No follow-up has been scheduled.  Please refer patient back if there are any questions or concerns.  I spent a total of 45 minutes reviewing chart data, face-to-face evaluation with the patient, counseling and coordination of care as detailed above.   Patient expressed understanding and was in agreement with this plan. He also understands that He can call clinic at any time with any questions, concerns, or complaints.    Jeralyn Ruths, MD   07/24/2022 3:30 PM

## 2022-07-27 LAB — PLATELET ANTIBODY PROFILE
Glycoprotein IV Antibody: NEGATIVE
HLA Ab Ser Ql EIA: NEGATIVE
IA/IIA Antibody: NEGATIVE
IB/IX Antibody: NEGATIVE
IIB/IIIA Antibody: NEGATIVE

## 2022-07-27 NOTE — Anesthesia Preprocedure Evaluation (Addendum)
Anesthesia Evaluation  Patient identified by MRN, date of birth, ID band Patient awake    Reviewed: Allergy & Precautions, NPO status , Patient's Chart, lab work & pertinent test results  Airway Mallampati: II  TM Distance: >3 FB Neck ROM: Full    Dental  (+) Teeth Intact, Dental Advisory Given   Pulmonary Current Smoker and Patient abstained from smoking.   Pulmonary exam normal breath sounds clear to auscultation       Cardiovascular negative cardio ROS Normal cardiovascular exam Rhythm:Regular Rate:Normal     Neuro/Psych negative neurological ROS     GI/Hepatic negative GI ROS, Neg liver ROS,,,  Endo/Other  negative endocrine ROS    Renal/GU negative Renal ROS     Musculoskeletal right shoulder rotator cuff tear, biceps tendinitis   Abdominal   Peds  Hematology  (+) Blood dyscrasia (Thrombocytopenia)   Anesthesia Other Findings Day of surgery medications reviewed with the patient.  Reproductive/Obstetrics                             Anesthesia Physical Anesthesia Plan  ASA: 2  Anesthesia Plan: General   Post-op Pain Management: Tylenol PO (pre-op)* and Regional block*   Induction: Intravenous  PONV Risk Score and Plan: 2 and Midazolam, Dexamethasone and Ondansetron  Airway Management Planned: Oral ETT  Additional Equipment:   Intra-op Plan:   Post-operative Plan: Extubation in OR  Informed Consent: I have reviewed the patients History and Physical, chart, labs and discussed the procedure including the risks, benefits and alternatives for the proposed anesthesia with the patient or authorized representative who has indicated his/her understanding and acceptance.     Dental advisory given  Plan Discussed with: CRNA  Anesthesia Plan Comments: (See.PAT note written 07/27/2022 by Shonna Chock, PA-C. History of chronic ITP. Per 07/24/22/ hematology follow-up with Dr.  Orlie Dakin, "Chronic ITP: Patient's platelet count has ranged from 41-61 since at least August 2016.  Full hematologic work-up prior that was completed at [facility] and reported as negative.  Patient has a history of antiplatelet antibodies, but these can be transient in nature and this was repeated today.  Results are pending at time of dictation.  Prior to patient's surgery, okay to try a course of steroids (1 mg/kg x 7 days) to see if this improves his platelet count.  If no improvement, okay to give platelet transfusion prior to and during procedure.  No follow-up has been scheduled." Plan for T&S, PT/INR, and repeat CBC on arrival for surgery.   **Surgeon requesting platelets be given pre-op for Plt count of 55k. Will start infusion in pre-op.)        Anesthesia Quick Evaluation

## 2022-07-28 ENCOUNTER — Other Ambulatory Visit: Payer: Self-pay | Admitting: Surgical

## 2022-07-28 MED ORDER — PREDNISONE 10 MG PO TABS: 90.0000 mg | ORAL_TABLET | Freq: Every day | ORAL | 0 refills | Status: DC

## 2022-08-03 NOTE — Telephone Encounter (Signed)
Patient called advised he has misplaced his Rx for Prednisone. Patient said he don't take medicine and do not know what he did with the Rx.  The number to contact patient is [phone]

## 2022-08-05 NOTE — Telephone Encounter (Signed)
I called and we spoke.  He found his medication.  No issues.

## 2022-08-06 ENCOUNTER — Ambulatory Visit (HOSPITAL_COMMUNITY): Payer: BC Managed Care – PPO | Admitting: Vascular Surgery

## 2022-08-06 ENCOUNTER — Other Ambulatory Visit: Payer: Self-pay

## 2022-08-06 ENCOUNTER — Encounter (HOSPITAL_COMMUNITY): Payer: Self-pay | Admitting: Orthopedic Surgery

## 2022-08-06 ENCOUNTER — Encounter (HOSPITAL_COMMUNITY)
Admission: RE | Disposition: A | Discharge status: Home / Self Care | Payer: Self-pay | Source: Home / Self Care | Attending: Orthopedic Surgery

## 2022-08-06 ENCOUNTER — Ambulatory Visit (HOSPITAL_COMMUNITY)
Admission: RE | Admit: 2022-08-06 | Discharge: 2022-08-06 | Disposition: A | Discharge status: Home / Self Care | Payer: BC Managed Care – PPO | Attending: Orthopedic Surgery | Admitting: Orthopedic Surgery

## 2022-08-06 DIAGNOSIS — S43431A Superior glenoid labrum lesion of right shoulder, initial encounter: Secondary | ICD-10-CM | POA: Diagnosis not present

## 2022-08-06 DIAGNOSIS — M65811 Other synovitis and tenosynovitis, right shoulder: Secondary | ICD-10-CM | POA: Diagnosis not present

## 2022-08-06 DIAGNOSIS — D693 Immune thrombocytopenic purpura: Secondary | ICD-10-CM | POA: Diagnosis not present

## 2022-08-06 DIAGNOSIS — Z01818 Encounter for other preprocedural examination: Secondary | ICD-10-CM

## 2022-08-06 DIAGNOSIS — M75121 Complete rotator cuff tear or rupture of right shoulder, not specified as traumatic: Secondary | ICD-10-CM | POA: Diagnosis not present

## 2022-08-06 DIAGNOSIS — M659 Synovitis and tenosynovitis, unspecified: Secondary | ICD-10-CM | POA: Diagnosis not present

## 2022-08-06 DIAGNOSIS — F17200 Nicotine dependence, unspecified, uncomplicated: Secondary | ICD-10-CM | POA: Diagnosis not present

## 2022-08-06 DIAGNOSIS — M75101 Unspecified rotator cuff tear or rupture of right shoulder, not specified as traumatic: Secondary | ICD-10-CM | POA: Diagnosis not present

## 2022-08-06 DIAGNOSIS — G8918 Other acute postprocedural pain: Secondary | ICD-10-CM | POA: Diagnosis not present

## 2022-08-06 DIAGNOSIS — M65911 Unspecified synovitis and tenosynovitis, right shoulder: Secondary | ICD-10-CM

## 2022-08-06 HISTORY — PX: SHOULDER ARTHROSCOPY WITH SUBACROMIAL DECOMPRESSION, ROTATOR CUFF REPAIR AND BICEP TENDON REPAIR: SHX5687

## 2022-08-06 LAB — TYPE AND SCREEN
ABO/RH(D): O POS
Antibody Screen: NEGATIVE

## 2022-08-06 LAB — CBC
HCT: 40.8 % (ref 39.0–52.0)
Hemoglobin: 14.4 g/dL (ref 13.0–17.0)
MCH: 35.5 pg — ABNORMAL HIGH (ref 26.0–34.0)
MCHC: 35.3 g/dL (ref 30.0–36.0)
MCV: 100.5 fL — ABNORMAL HIGH (ref 80.0–100.0)
Platelets: 55 10*3/uL — ABNORMAL LOW (ref 150–400)
RBC: 4.06 MIL/uL — ABNORMAL LOW (ref 4.22–5.81)
RDW: 11.9 % (ref 11.5–15.5)
WBC: 5.8 10*3/uL (ref 4.0–10.5)
nRBC: 0 % (ref 0.0–0.2)

## 2022-08-06 LAB — PROTIME-INR
INR: 1 (ref 0.8–1.2)
Prothrombin Time: 12.6 seconds (ref 11.4–15.2)

## 2022-08-06 SURGERY — SHOULDER ARTHROSCOPY WITH SUBACROMIAL DECOMPRESSION, ROTATOR CUFF REPAIR AND BICEP TENDON REPAIR
Anesthesia: Regional | Laterality: Right

## 2022-08-06 MED ORDER — SODIUM CHLORIDE 0.9% IV SOLUTION: Freq: Once | INTRAVENOUS | Status: DC

## 2022-08-06 MED ORDER — FENTANYL CITRATE (PF) 100 MCG/2ML IJ SOLN
INTRAMUSCULAR | Status: AC
  Administered 2022-08-06: 50 ug via INTRAVENOUS
  Filled 2022-08-06: qty 2

## 2022-08-06 MED ORDER — MIDAZOLAM HCL 2 MG/2ML IJ SOLN: 2.0000 mg | Freq: Once | INTRAMUSCULAR | Status: AC

## 2022-08-06 MED ORDER — OXYCODONE-ACETAMINOPHEN 5-325 MG PO TABS
1.0000 | ORAL_TABLET | Freq: Once | ORAL | Status: AC
  Administered 2022-08-06: 1 via ORAL

## 2022-08-06 MED ORDER — PROPOFOL 10 MG/ML IV BOLUS
INTRAVENOUS | Status: AC
  Filled 2022-08-06: qty 20

## 2022-08-06 MED ORDER — DEXAMETHASONE SODIUM PHOSPHATE 10 MG/ML IJ SOLN
INTRAMUSCULAR | Status: DC | PRN
  Administered 2022-08-06: 10 mg via INTRAVENOUS
  Administered 2022-08-06: 20 mg via INTRAVENOUS

## 2022-08-06 MED ORDER — METHOCARBAMOL 1000 MG/10ML IJ SOLN: 500.0000 mg | Freq: Once | INTRAVENOUS | Status: AC

## 2022-08-06 MED ORDER — PROMETHAZINE HCL 25 MG/ML IJ SOLN: 6.2500 mg | INTRAMUSCULAR | Status: DC | PRN

## 2022-08-06 MED ORDER — METHOCARBAMOL 500 MG PO TABS: 500.0000 mg | ORAL_TABLET | Freq: Three times a day (TID) | ORAL | 1 refills | Status: AC | PRN

## 2022-08-06 MED ORDER — PROPOFOL 10 MG/ML IV BOLUS
INTRAVENOUS | Status: DC | PRN
  Administered 2022-08-06: 200 mg via INTRAVENOUS

## 2022-08-06 MED ORDER — AMISULPRIDE (ANTIEMETIC) 5 MG/2ML IV SOLN
INTRAVENOUS | Status: AC
  Filled 2022-08-06: qty 4

## 2022-08-06 MED ORDER — GABAPENTIN 300 MG PO CAPS: 300.0000 mg | ORAL_CAPSULE | Freq: Three times a day (TID) | ORAL | 0 refills | Status: AC

## 2022-08-06 MED ORDER — FENTANYL CITRATE (PF) 250 MCG/5ML IJ SOLN
INTRAMUSCULAR | Status: DC | PRN
  Administered 2022-08-06 (×2): 100 ug via INTRAVENOUS

## 2022-08-06 MED ORDER — EPINEPHRINE PF 1 MG/ML IJ SOLN
INTRAMUSCULAR | Status: AC
  Filled 2022-08-06: qty 4

## 2022-08-06 MED ORDER — DEXAMETHASONE SODIUM PHOSPHATE 10 MG/ML IJ SOLN
INTRAMUSCULAR | Status: AC
  Filled 2022-08-06: qty 1

## 2022-08-06 MED ORDER — MIDAZOLAM HCL 2 MG/2ML IJ SOLN
INTRAMUSCULAR | Status: AC
  Administered 2022-08-06: 2 mg via INTRAVENOUS
  Filled 2022-08-06: qty 2

## 2022-08-06 MED ORDER — LACTATED RINGERS IV SOLN: INTRAVENOUS | Status: DC

## 2022-08-06 MED ORDER — POVIDONE-IODINE 7.5 % EX SOLN: Freq: Once | CUTANEOUS | Status: AC

## 2022-08-06 MED ORDER — AMISULPRIDE (ANTIEMETIC) 5 MG/2ML IV SOLN
10.0000 mg | Freq: Once | INTRAVENOUS | Status: AC
  Administered 2022-08-06: 10 mg via INTRAVENOUS

## 2022-08-06 MED ORDER — OXYCODONE-ACETAMINOPHEN 5-325 MG PO TABS: 1.0000 | ORAL_TABLET | ORAL | 0 refills | Status: DC | PRN

## 2022-08-06 MED ORDER — FENTANYL CITRATE (PF) 250 MCG/5ML IJ SOLN
INTRAMUSCULAR | Status: AC
  Filled 2022-08-06: qty 5

## 2022-08-06 MED ORDER — POVIDONE-IODINE 10 % EX SWAB
2.0000 | Freq: Once | CUTANEOUS | Status: AC
  Administered 2022-08-06: 2 via TOPICAL

## 2022-08-06 MED ORDER — FENTANYL CITRATE (PF) 100 MCG/2ML IJ SOLN: 50.0000 ug | Freq: Once | INTRAMUSCULAR | Status: AC

## 2022-08-06 MED ORDER — BUPIVACAINE HCL (PF) 0.5 % IJ SOLN
INTRAMUSCULAR | Status: DC | PRN
  Administered 2022-08-06: 10 mL via PERINEURAL

## 2022-08-06 MED ORDER — OXYCODONE-ACETAMINOPHEN 5-325 MG PO TABS
ORAL_TABLET | ORAL | Status: AC
  Filled 2022-08-06: qty 1

## 2022-08-06 MED ORDER — SODIUM CHLORIDE 0.9 % IR SOLN
Status: DC | PRN
  Administered 2022-08-06: 1000 mL

## 2022-08-06 MED ORDER — FENTANYL CITRATE (PF) 100 MCG/2ML IJ SOLN
25.0000 ug | INTRAMUSCULAR | Status: DC | PRN
  Administered 2022-08-06 (×2): 50 ug via INTRAVENOUS

## 2022-08-06 MED ORDER — ACETAMINOPHEN 500 MG PO TABS
1000.0000 mg | ORAL_TABLET | Freq: Once | ORAL | Status: AC
  Administered 2022-08-06: 1000 mg via ORAL
  Filled 2022-08-06: qty 2

## 2022-08-06 MED ORDER — CEFAZOLIN SODIUM-DEXTROSE 2-4 GM/100ML-% IV SOLN
2.0000 g | INTRAVENOUS | Status: AC
  Administered 2022-08-06: 2 g via INTRAVENOUS
  Filled 2022-08-06: qty 100

## 2022-08-06 MED ORDER — TRANEXAMIC ACID-NACL 1000-0.7 MG/100ML-% IV SOLN
1000.0000 mg | INTRAVENOUS | Status: AC
  Administered 2022-08-06: 1000 mg via INTRAVENOUS
  Filled 2022-08-06: qty 100

## 2022-08-06 MED ORDER — FENTANYL CITRATE (PF) 100 MCG/2ML IJ SOLN
INTRAMUSCULAR | Status: AC
  Filled 2022-08-06: qty 2

## 2022-08-06 MED ORDER — SUGAMMADEX SODIUM 200 MG/2ML IV SOLN
INTRAVENOUS | Status: DC | PRN
  Administered 2022-08-06: 200 mg via INTRAVENOUS

## 2022-08-06 MED ORDER — BUPIVACAINE LIPOSOME 1.3 % IJ SUSP
INTRAMUSCULAR | Status: DC | PRN
  Administered 2022-08-06: 10 mL via PERINEURAL

## 2022-08-06 MED ORDER — ONDANSETRON HCL 4 MG/2ML IJ SOLN
INTRAMUSCULAR | Status: AC
  Filled 2022-08-06: qty 2

## 2022-08-06 MED ORDER — METHOCARBAMOL 500 MG PO TABS
500.0000 mg | ORAL_TABLET | Freq: Once | ORAL | Status: AC
  Administered 2022-08-06: 500 mg via ORAL

## 2022-08-06 MED ORDER — ONDANSETRON HCL 4 MG/2ML IJ SOLN
INTRAMUSCULAR | Status: DC | PRN
  Administered 2022-08-06: 4 mg via INTRAVENOUS

## 2022-08-06 MED ORDER — ROCURONIUM BROMIDE 10 MG/ML (PF) SYRINGE
PREFILLED_SYRINGE | INTRAVENOUS | Status: DC | PRN
  Administered 2022-08-06: 100 mg via INTRAVENOUS

## 2022-08-06 MED ORDER — CHLORHEXIDINE GLUCONATE 0.12 % MT SOLN
OROMUCOSAL | Status: AC
  Administered 2022-08-06: 15 mL
  Filled 2022-08-06: qty 15

## 2022-08-06 MED ORDER — METHOCARBAMOL 500 MG PO TABS
ORAL_TABLET | ORAL | Status: AC
  Filled 2022-08-06: qty 1

## 2022-08-06 MED ORDER — PHENYLEPHRINE HCL-NACL 20-0.9 MG/250ML-% IV SOLN
INTRAVENOUS | Status: DC | PRN
  Administered 2022-08-06: 10 ug/min via INTRAVENOUS

## 2022-08-06 MED ORDER — SODIUM CHLORIDE 0.9 % IR SOLN
Status: DC | PRN
  Administered 2022-08-06 (×2): 3001 mL

## 2022-08-06 SURGICAL SUPPLY — 49 items
ANCHOR FBRTK 2.6 SUTURETAP 1.3 (Anchor) IMPLANT
ANCHOR SWIVELOCK BIO 4.75X19.1 (Anchor) IMPLANT
BAG COUNTER SPONGE SURGICOUNT (BAG) IMPLANT
BLADE EXCALIBUR 4.0X13 (MISCELLANEOUS) IMPLANT
CHLORAPREP W/TINT 26 (MISCELLANEOUS) IMPLANT
COVER SURGICAL LIGHT HANDLE (MISCELLANEOUS) ×1 IMPLANT
DRAPE INCISE IOBAN 66X45 STRL (DRAPES) ×2 IMPLANT
DRAPE STERI 35X30 U-POUCH (DRAPES) ×1 IMPLANT
DRAPE U-SHAPE 47X51 STRL (DRAPES) ×2 IMPLANT
DRSG TEGADERM 4X4.75 (GAUZE/BANDAGES/DRESSINGS) IMPLANT
DW OUTFLOW CASSETTE/TUBE SET (MISCELLANEOUS) ×1 IMPLANT
ELECT REM PT RETURN 9FT ADLT (ELECTROSURGICAL) ×1
ELECTRODE REM PT RTRN 9FT ADLT (ELECTROSURGICAL) ×1 IMPLANT
GAUZE SPONGE 4X4 12PLY STRL (GAUZE/BANDAGES/DRESSINGS) ×1 IMPLANT
GAUZE SPONGE 4X4 12PLY STRL LF (GAUZE/BANDAGES/DRESSINGS) ×1 IMPLANT
GAUZE XEROFORM 1X8 LF (GAUZE/BANDAGES/DRESSINGS) IMPLANT
GLOVE BIOGEL PI IND STRL 7.0 (GLOVE) ×1 IMPLANT
GLOVE BIOGEL PI IND STRL 8 (GLOVE) ×1 IMPLANT
GLOVE ECLIPSE 7.0 STRL STRAW (GLOVE) ×1 IMPLANT
GLOVE ECLIPSE 8.0 STRL XLNG CF (GLOVE) ×1 IMPLANT
KIT BASIN OR (CUSTOM PROCEDURE TRAY) ×1 IMPLANT
KIT TURNOVER KIT B (KITS) ×1 IMPLANT
MANIFOLD NEPTUNE II (INSTRUMENTS) ×1 IMPLANT
NDL HD SCORPION MEGA LOADER (NEEDLE) IMPLANT
NDL SPNL 18GX3.5 QUINCKE PK (NEEDLE) ×1 IMPLANT
NDL TAPERED W/ NITINOL LOOP (MISCELLANEOUS) IMPLANT
NEEDLE SPNL 18GX3.5 QUINCKE PK (NEEDLE) ×1 IMPLANT
NEEDLE TAPERED W/ NITINOL LOOP (MISCELLANEOUS) ×1 IMPLANT
NS IRRIG 1000ML POUR BTL (IV SOLUTION) ×1 IMPLANT
PACK SHOULDER (CUSTOM PROCEDURE TRAY) ×1 IMPLANT
PAD ARMBOARD 7.5X6 YLW CONV (MISCELLANEOUS) ×2 IMPLANT
PROBE APOLLO 90XL (SURGICAL WAND) ×1 IMPLANT
RESTRAINT HEAD UNIVERSAL NS (MISCELLANEOUS) ×1 IMPLANT
SLING ARM IMMOBILIZER LRG (SOFTGOODS) IMPLANT
SPONGE T-LAP 4X18 ~~LOC~~+RFID (SPONGE) ×2 IMPLANT
STRIP CLOSURE SKIN 1/2X4 (GAUZE/BANDAGES/DRESSINGS) IMPLANT
SUT ETHILON 3 0 PS 1 (SUTURE) IMPLANT
SUT MNCRL AB 3-0 PS2 18 (SUTURE) IMPLANT
SUT VIC AB 1 CT1 27 (SUTURE) ×2
SUT VIC AB 1 CT1 27XBRD ANBCTR (SUTURE) IMPLANT
SUT VIC AB 2-0 CT1 27 (SUTURE) ×1
SUT VIC AB 2-0 CT1 TAPERPNT 27 (SUTURE) IMPLANT
SUT VICRYL 0 AB UR-6 (SUTURE) IMPLANT
SUT VICRYL 0 UR6 27IN ABS (SUTURE) IMPLANT
TOWEL GREEN STERILE (TOWEL DISPOSABLE) ×1 IMPLANT
TOWEL GREEN STERILE FF (TOWEL DISPOSABLE) ×1 IMPLANT
TUBING ARTHROSCOPY IRRIG 16FT (MISCELLANEOUS) ×1 IMPLANT
WAND ABLATOR APOLLO I90 (BUR) IMPLANT
WATER STERILE IRR 1000ML POUR (IV SOLUTION) ×1 IMPLANT

## 2022-08-06 NOTE — Op Note (Signed)
NAME: Christian Roberson, Christian Roberson MEDICAL RECORD NO: [id number] ACCOUNT NO: [id number] DATE OF BIRTH: August 10, 1981 FACILITY: [facility] LOCATION: [facility] PHYSICIAN: Graylin Shiver. August Saucer, MD  Operative Report   DATE OF PROCEDURE: 08/06/2022  PREOPERATIVE DIAGNOSES:  Right shoulder rotator cuff tear and biceps tendinitis.  POSTOPERATIVE DIAGNOSES:  Right shoulder rotator cuff tear measuring 2 x 3 cm with intra-articular synovitis and degenerative labral fraying and retraction of the biceps into the arm with biceps tendon not visible in the bicipital groove.  PROCEDURE:  Right shoulder arthroscopy with limited debridement of the superior labrum and mini open rotator cuff tear repair and subacromial decompression of 2 x 3 cm tear using 2 x 2 anchor construct.  SURGEON:  Graylin Shiver. August Saucer, MD  ASSISTANT:  Karenann Cai, PA  INDICATIONS:  The patient is a 41 year old patient with right shoulder pain.  MRI scan shows retracted rotator cuff tear.  He presents for operative management after explanation of risks and benefits.  DESCRIPTION OF PROCEDURE:  The patient was brought to the operating room where general anesthetic was induced.  Preoperative antibiotics administered.  Timeout was called.  The patient's head was placed in the beach chair position with the head in  neutral position.  Right shoulder, arm and hand prescrubbed with alcohol and Betadine, allowed to air dry, prepped with DuraPrep solution and draped in sterile manner.  Ioban used to seal the operative field and cover the axilla.  After calling timeout,  posterior portal was created.  Anterior portal created under direct visualization.  Diagnostic arthroscopy was performed.  The patient had no intra-articular biceps tendon, which was in accordance with the MRI scanning.  There was synovitis and fraying  of the labrum, which was debrided using the Arthrocare wand. Anterior inferior glenohumeral ligament and posterior inferior glenohumeral ligaments intact.  Glenohumeral surfaces also intact.  Rotator cuff tear identified involving the supraspinatus and  infraspinatus tendons.  Instruments were removed.  Portals were closed using 3-0 nylon.  Ioban then used to cover the entire operative field.  Incision made off the anterolateral margin of the acromion. Skin and subcutaneous tissue sharply divided.   Deltoid was split at measured distance of 4 cm from the anterolateral margin of the acromion. This was marked with #1 Vicryl suture.  Deltoid was split. Bursectomy performed. Acromioplasty performed.  Bone of the acromion was very hard.  Next, the  rotator cuff tear was identified.  After establishing adequate subacromial space, seven 0 Vicryl FiberWire sutures were used in grasping fashion along the length of the tendon, which was retracted.  Then, releases were performed of the coracohumeral  ligament as well as any adhesions inside the joint.  This was done with a Cobb elevator. We were able to mobilize the tendon to retract back to the lateral edge of the tuberosity.  Next, the landing zone on the tuberosity was prepared using a rongeur.   Two Arthrex SutureTaks with 4 SutureTapes per SutureTak were then placed at the articular surface margin and lateral and the greater tuberosity.  Using a Scorpion suture passer, the 8 SutureTapes were placed posterior to anterior equidistant.  Then, with  the rotator cuff tear reduced using the Vicryl sutures, the SutureTapes were tied x4 and the limbs were crossed.  The SutureTape limbs were then matched with half of the Vicryl sutures each and these 2 grouping of sutures were placed in [place] with  the arm in abduction.  All in all, a watertight repair was achieved.  Thorough irrigation was performed. Knotless  mechanism used for additional anterior fixation x1.  After thorough irrigation, the self-retaining retractor was released.  Instruments were  removed.  The deltoid split was closed using #1 Vicryl suture followed  by interrupted inverted 0 Vicryl suture, 2-0 Vicryl suture and 3-0 Monocryl with Steri-Strips and waterproof dressings applied.  The patient tolerated the procedure well without  immediate complications, transferred to the recovery room in stable condition.  Luke's assistance was required at all times for opening, closing, mobilization of tissue.  His assistance was a medical necessity.   VAI D: 08/06/2022 3:33:26 pm T: 08/06/2022 9:34:00 pm  JOB: [id number]/ [id number]

## 2022-08-06 NOTE — Anesthesia Postprocedure Evaluation (Signed)
Anesthesia Post Note  Patient: TORIAN QUINTERO  Procedure(s) Performed: RIGHT SHOULDER ARTHROSCOPY, DEBRIDEMENT, MINI OPEN ROTATOR CUFF TEAR REPAIR, BICEPS TENODESIS (Right)     Patient location during evaluation: PACU Anesthesia Type: Regional and General Level of consciousness: sedated Pain management: pain level controlled Vital Signs Assessment: post-procedure vital signs reviewed and stable Respiratory status: spontaneous breathing and respiratory function stable Cardiovascular status: stable Postop Assessment: no apparent nausea or vomiting Anesthetic complications: no   No notable events documented.  Last Vitals:  Vitals:   08/06/22 1630 08/06/22 1645  BP: (!) 137/94   Pulse: 71   Resp: 13   Temp: 36.9 C   SpO2: 95% 95%    Last Pain:  Vitals:   08/06/22 1630  TempSrc:   PainSc: 4                  Stanislav Gervase DANIEL

## 2022-08-06 NOTE — H&P (Signed)
Christian Roberson is an 41 y.o. male.   Chief Complaint: Right shoulder pain HPI: Christian Roberson is a 42 y.o. male who presents to the office for MRI review. Patient denies any changes in symptoms.  Continues to complain mainly of anterior right shoulder pain that radiates to the bicep.  He has difficulty lifting his arm at times.  No neck pain.  No history of prior neck or shoulder surgery.  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  Enjoys playing golf and working out at [employer] in his free time.  Lives at home with his wife.   Past Medical History:  Diagnosis Date   Chronic idiopathic thrombocytopenia (HCC)     Past Surgical History:  Procedure Laterality Date   HAND SURGERY     ORIF TOE FRACTURE Right 05/20/2016   Procedure: OPEN REDUCTION INTERNAL FIXATION (ORIF) RIGHT 2ND  METATARSAL (TOE) FRACTURE, OPEN REDUCTION INTERNAL FIXATION LISFRANC JOINT;  Surgeon: Eldred Manges, MD;  Location: [facility] OR;  Service: Orthopedics;  Laterality: Right;   TONSILLECTOMY      Family History  Problem Relation Age of Onset   Hypertension Father    Heart disease Father    Cancer Maternal Uncle    Leukemia Maternal Uncle    Leukemia Cousin    Leukemia Cousin    Leukemia Cousin    Social History:  reports that he has been smoking. He has never been exposed to tobacco smoke. He has never used smokeless tobacco. He reports current alcohol use. No history on file for drug use.  Allergies: No Known Allergies  Medications Prior to Admission  Medication Sig Dispense Refill   predniSONE (DELTASONE) 10 MG tablet Take 9 tablets (90 mg total) by mouth daily. 63 tablet 0    Results for orders placed or performed during the hospital encounter of 08/06/22 (from the past 48 hour(s))  CBC     Status: Abnormal   Collection Time: 08/06/22  9:01 AM  Result Value Ref Range   WBC 5.8 4.0 - 10.5 K/uL    RBC 4.06 (L) 4.22 - 5.81 MIL/uL   Hemoglobin 14.4 13.0 - 17.0 g/dL   HCT 27.0 62.3 - 76.2 %   MCV 100.5 (H) 80.0 - 100.0 fL   MCH 35.5 (H) 26.0 - 34.0 pg   MCHC 35.3 30.0 - 36.0 g/dL   RDW 83.1 51.7 - 61.6 %   Platelets 55 (L) 150 - 400 K/uL    Comment: Immature Platelet Fraction may be clinically indicated, consider ordering this additional test [id number] REPEATED TO VERIFY    nRBC 0.0 0.0 - 0.2 %    Comment: Performed at [facility], [address], [place], [place] [address]  Protime-INR     Status: None   Collection Time: 08/06/22  9:01 AM  Result Value Ref Range   Prothrombin Time 12.6 11.4 - 15.2 seconds   INR 1.0 0.8 - 1.2    Comment: (NOTE) INR goal varies based on device and disease states. Performed at [facility], [address], [place], [place] [address]   Type and screen [facility]     Status: None   Collection Time: 08/06/22  9:30 AM  Result Value Ref Range   ABO/RH(D) O POS    Antibody Screen NEG    Sample Expiration  08/09/2022,2359 Performed at [facility], [address], [place], [place] [address]   Prepare platelet pheresis     Status: None (Preliminary result)   Collection Time: 08/06/22 10:58 AM  Result Value Ref Range   Unit Number [id number]    Blood Component Type PLTP1 PSORALEN TREATED    Unit division 00    Status of Unit ISSUED    Transfusion Status      OK TO TRANSFUSE Performed at [facility], [address], [place], [place] [address]    No results found.  Review of Systems  Musculoskeletal:  Positive for arthralgias.  All other systems reviewed and are negative.   Blood pressure (!) 140/98, pulse 71, temperature 97.8 F (36.6 C), temperature source Oral, resp. rate 20, height 6' (1.829 m), weight 95.3 kg, SpO2 96 %. Physical Exam Vitals reviewed.  HENT:     Head: Normocephalic.     Nose: Nose normal.  Eyes:     Pupils: Pupils are equal, round, and reactive to light.   Cardiovascular:     Rate and Rhythm: Normal rate.     Pulses: Normal pulses.  Pulmonary:     Effort: Pulmonary effort is normal.  Abdominal:     General: Abdomen is flat.  Skin:    General: Skin is warm.     Capillary Refill: Capillary refill takes less than 2 seconds.  Neurological:     General: No focal deficit present.     Mental Status: He is alert.  Psychiatric:        Mood and Affect: Mood normal.   Ortho exam demonstrates right shoulder with 40 degrees external rotation, 90 degrees abduction, 150 degrees forward flexion. This compared with the left shoulder with 40 degrees X rotation, 120 degrees abduction, 170 degrees forward flexion. He has active range of motion equivalent to passive range of motion. No tenderness over the [facility] joint. Moderate tenderness over the bicipital groove. No deformity noted. No cellulitis or skin changes noted. No tenderness over the axial cervical spine. 5/5 motor strength of bilateral grip strength, finger abduction, pronation/supination, bicep, tricep, deltoid. Axillary nerve is intact with deltoid firing. 5/5 interest Natus and subscapularis strength. 5 -/5 supraspinatus drinks of the right shoulder very 5/5 in the left. He does have reproduction of pain with stressing the supraspinatus on exam today. Negative Hornblower sign. Negative belly press test. Negative external rotation lag sign.    Assessment/Plan Impression is right shoulder rotator cuff tear with tearing of the intra-articular biceps tendon.  MRI scan does show tear of the supraspinatus tendon and biceps tendon tearing.  Plan at this time is shoulder arthroscopy with debridement and mini open rotator cuff tear repair and exploration of the bicipital groove and if the tendon is present we will tenodesis the tendon.  Subacromial decompression also to be performed.  The risk and benefits of the procedure discussed with the patient include not limited to infection or vessel damage incomplete  restoration of function as well as continued pain.  Patient's platelets this morning 55 and that will be supplemented prior to surgery.  Also plan to use TXA.  Patient understands risk and benefits as well as extensive nature of the rehabilitative process.  All questions answered  Burnard Bunting, MD 08/06/2022, 12:29 PM

## 2022-08-06 NOTE — Transfer of Care (Signed)
Immediate Anesthesia Transfer of Care Note  Patient: Christian Roberson  Procedure(s) Performed: RIGHT SHOULDER ARTHROSCOPY, DEBRIDEMENT, MINI OPEN ROTATOR CUFF TEAR REPAIR, BICEPS TENODESIS (Right)  Patient Location: PACU  Anesthesia Type:General and Regional  Level of Consciousness: drowsy, patient cooperative, and responds to stimulation  Airway & Oxygen Therapy: Patient Spontanous Breathing  Post-op Assessment: Report given to RN and Post -op Vital signs reviewed and stable  Post vital signs: stable  Last Vitals:  Vitals Value Taken Time  BP 145/91 08/06/22 1540  Temp    Pulse 92 08/06/22 1541  Resp 14 08/06/22 1541  SpO2 94 % 08/06/22 1541  Vitals shown include unvalidated device data.  Last Pain:  Vitals:   08/06/22 1125  TempSrc: Oral  PainSc:          Complications: No notable events documented.

## 2022-08-06 NOTE — Anesthesia Procedure Notes (Signed)
Anesthesia Regional Block: Interscalene brachial plexus block   Pre-Anesthetic Checklist: , timeout performed,  Correct Patient, Correct Site, Correct Laterality,  Correct Procedure, Correct Position, site marked,  Risks and benefits discussed,  Surgical consent,  Pre-op evaluation,  At surgeon's request and post-op pain management  Laterality: Right  Prep: chloraprep       Needles:  Injection technique: Single-shot  Needle Type: Echogenic Needle     Needle Length: 9cm  Needle Gauge: 21     Additional Needles:   Procedures:,,,, ultrasound used (permanent image in chart),,    Narrative:  Start time: 08/06/2022 11:18 AM End time: 08/06/2022 11:24 AM Injection made incrementally with aspirations every 5 mL.  Performed by: Personally  Anesthesiologist: Collene Schlichter, MD  Additional Notes: No pain on injection. No increased resistance to injection. Injection made in 5cc increments.  Good needle visualization.  Patient tolerated procedure well.

## 2022-08-06 NOTE — Brief Op Note (Signed)
   08/06/2022  3:27 PM  PATIENT:  Christian Roberson  41 y.o. male  PRE-OPERATIVE DIAGNOSIS:  right shoulder rotator cuff tear, biceps tendinitis  POST-OPERATIVE DIAGNOSIS:  right shoulder rotator cuff tear, intra-articular labral synovitis and fraying  PROCEDURE:  Procedure(s): RIGHT SHOULDER ARTHROSCOPY, DEBRIDEMENT, MINI OPEN ROTATOR CUFF TEAR REPAIR,  SURGEON:  Surgeon(s): August Saucer, Corrie Mckusick, MD  ASSISTANT: Karenann Cai  ANESTHESIA:   General  EBL: 25 ml    Total I/O In: 1100 [I.V.:1000; IV Piggyback:100] Out: -   BLOOD ADMINISTERED: none  DRAINS: None  LOCAL MEDICATIONS USED:  none  SPECIMEN:  No Specimen  COUNTS:  YES  TOURNIQUET:  * No tourniquets in log *  DICTATION: .Other Dictation: Dictation Number [id number]  PLAN OF CARE: Discharge to home after PACU  PATIENT DISPOSITION:  PACU - hemodynamically stable

## 2022-08-07 ENCOUNTER — Telehealth: Payer: Self-pay | Admitting: Orthopedic Surgery

## 2022-08-07 ENCOUNTER — Encounter (HOSPITAL_COMMUNITY): Payer: Self-pay | Admitting: Orthopedic Surgery

## 2022-08-07 ENCOUNTER — Other Ambulatory Visit: Payer: Self-pay | Admitting: Surgical

## 2022-08-07 ENCOUNTER — Telehealth: Payer: Self-pay

## 2022-08-07 LAB — PREPARE PLATELET PHERESIS: Unit division: 0

## 2022-08-07 LAB — BPAM PLATELET PHERESIS
Blood Product Expiration Date: 202312172359
ISSUE DATE / TIME: 202312141112
Unit Type and Rh: 6200

## 2022-08-07 MED ORDER — OXYCODONE HCL 5 MG PO TABS: 5.0000 mg | ORAL_TABLET | ORAL | 0 refills | Status: AC | PRN

## 2022-08-07 NOTE — Telephone Encounter (Signed)
Pt's wife called stating pharmacy states they did not get medication refill of pain meds. Please resend to pharmacy on file [facility]

## 2022-08-07 NOTE — Telephone Encounter (Signed)
Patient's wife called wanting to know how often does patient use the ice machine and when does he start using the ice machine?  Stated that Oxycodone is not helping and wanted to know if that is normal?  CB# [phone].  Please advise.  Thank you.

## 2022-08-07 NOTE — Telephone Encounter (Signed)
I called and answered all of her questions.  I sent in a prescription for oxycodone without acetaminophen so that they can take 1 to 2 pills as needed every 4 hours.  They understand the need to wean off of this medication but pain is pretty uncontrolled at this point.  We also talked about what we did at the time of surgery.  Follow-up next week.

## 2022-08-09 DIAGNOSIS — M75121 Complete rotator cuff tear or rupture of right shoulder, not specified as traumatic: Secondary | ICD-10-CM

## 2022-08-09 DIAGNOSIS — M65911 Unspecified synovitis and tenosynovitis, right shoulder: Secondary | ICD-10-CM

## 2022-08-09 DIAGNOSIS — M65811 Other synovitis and tenosynovitis, right shoulder: Secondary | ICD-10-CM

## 2022-08-10 ENCOUNTER — Other Ambulatory Visit: Payer: Self-pay | Admitting: Surgical

## 2022-08-10 NOTE — Telephone Encounter (Signed)
This was sent in and picked up 12/15 according to PDMP

## 2022-08-12 ENCOUNTER — Telehealth: Payer: Self-pay | Admitting: Orthopedic Surgery

## 2022-08-12 ENCOUNTER — Encounter: Payer: Self-pay | Admitting: Orthopedic Surgery

## 2022-08-12 ENCOUNTER — Ambulatory Visit (INDEPENDENT_AMBULATORY_CARE_PROVIDER_SITE_OTHER): Payer: BC Managed Care – PPO | Admitting: Surgical

## 2022-08-12 DIAGNOSIS — M75101 Unspecified rotator cuff tear or rupture of right shoulder, not specified as traumatic: Secondary | ICD-10-CM

## 2022-08-12 NOTE — Telephone Encounter (Signed)
Dean request patient to come back in 2 weeks no appts avail. Please advise

## 2022-08-12 NOTE — Progress Notes (Signed)
Post-Op Visit Note   Patient: Christian Roberson           Date of Birth: August 15, 1981           MRN: [id number] Visit Date: 08/12/2022 PCP: Patient, No Pcp Per   Assessment & Plan:  Chief Complaint:  Chief Complaint  Patient presents with   Right Shoulder - Routine Post Op    08/06/22 Right shoulder arthroscopy with limited debridement of the superior labrum and mini open rotator cuff tear repair and subacromial decompression   Visit Diagnoses:  1. Tear of right supraspinatus tendon     Plan: ADIS STURGILL is a 41 y.o. male who presents s/p right shoulder rotator cuff repair and biceps tenodesis on 08/06/2022.  Patient is doing well and pain is overall controlled.  Denies any chest pain, SOB, fevers, chills, dizziness.  Taking Advil for pain control.  Has weaned off of oxycodone from every 4 hours to just 1 time per day to take at night to help sleep.  He is sleeping in recliner.  On exam, patient has range of motion 15 degrees external rotation, 50 degrees abduction, 90 degrees forward flexion.  Intact EPL, FPL, finger abduction, finger adduction, pronation/supination, bicep, tricep, deltoid of operative extremity.  Axillary nerve intact with deltoid firing.  Incisions are healing well without evidence of infection or dehiscence.  Sutures removed and replaced with Steri-Strips today.  2+ radial pulse of the operative extremity  Plan is to continue with range of motion with CPM brace.  No lifting with the operative arm or active range of motion of the operative shoulder.  He may return to work but no lifting.  He must remain in the sling when he is working.  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  Return in 2 weeks for clinical recheck with Dr. August Saucer and initiation of physical therapy at that point..   Follow-Up Instructions: No follow-ups on file.   Orders:  No orders of the defined types were placed in this encounter.  No orders of the defined types were placed in  this encounter.   Imaging: No results found.  PMFS History: Patient Active Problem List   Diagnosis Date Noted   Synovitis of right shoulder 08/09/2022   Nontraumatic complete tear of right rotator cuff 08/09/2022   Rotator cuff arthropathy of right shoulder 02/11/2021   Snoring 02/11/2021   Smoker 02/11/2021   Dislocation of tarsometatarsal joint of right foot 09/18/2016   Closed dislocation of tarsometatarsal joint of right foot 07/07/2016   Crush injury of right foot 05/18/2016   Past Medical History:  Diagnosis Date   Chronic idiopathic thrombocytopenia (HCC)     Family History  Problem Relation Age of Onset   Hypertension Father    Heart disease Father    Cancer Maternal Uncle    Leukemia Maternal Uncle    Leukemia Cousin    Leukemia Cousin    Leukemia Cousin     Past Surgical History:  Procedure Laterality Date   HAND SURGERY     ORIF TOE FRACTURE Right 05/20/2016   Procedure: OPEN REDUCTION INTERNAL FIXATION (ORIF) RIGHT 2ND  METATARSAL (TOE) FRACTURE, OPEN REDUCTION INTERNAL FIXATION LISFRANC JOINT;  Surgeon: Eldred Manges, MD;  Location: [facility] OR;  Service: Orthopedics;  Laterality: Right;   SHOULDER ARTHROSCOPY WITH SUBACROMIAL DECOMPRESSION, ROTATOR CUFF REPAIR AND BICEP TENDON REPAIR Right 08/06/2022   Procedure: RIGHT SHOULDER ARTHROSCOPY, DEBRIDEMENT, MINI OPEN ROTATOR CUFF TEAR  REPAIR, BICEPS TENODESIS;  Surgeon: Cammy Copa, MD;  Location: [facility];  Service: Orthopedics;  Laterality: Right;   TONSILLECTOMY     Social History   Occupational History   Not on file  Tobacco Use   Smoking status: Some Days    Passive exposure: Never   Smokeless tobacco: Never  Vaping Use   Vaping Use: Some days  Substance and Sexual Activity   Alcohol use: Yes   Drug use: Not on file   Sexual activity: Not on file

## 2022-08-13 ENCOUNTER — Encounter: Payer: BC Managed Care – PPO | Admitting: Orthopedic Surgery

## 2022-08-13 NOTE — Telephone Encounter (Signed)
Scheduled

## 2022-08-26 ENCOUNTER — Ambulatory Visit (INDEPENDENT_AMBULATORY_CARE_PROVIDER_SITE_OTHER): Payer: BC Managed Care – PPO | Admitting: Orthopedic Surgery

## 2022-08-26 DIAGNOSIS — M75101 Unspecified rotator cuff tear or rupture of right shoulder, not specified as traumatic: Secondary | ICD-10-CM

## 2022-08-26 NOTE — Progress Notes (Signed)
   Post-Op Visit Note   Patient: Christian Roberson           Date of Birth: [birth year]           MRN: [id number] Visit Date: 08/26/2022 PCP: Patient, No Pcp Per   Assessment & Plan:  Chief Complaint:  Chief Complaint  Patient presents with   Right Shoulder - Routine Post Op     08/06/22 Right shoulder arthroscopy with limited debridement of the superior labrum and mini open rotator cuff tear repair and subacromial decompression     Visit Diagnoses:  1. Tear of right supraspinatus tendon     Plan: Aaron Edelman is a 42 year old patient who is now about 3 weeks out right shoulder arthroscopy with mini open rotator cuff tear repair subacromial decompression.  He has been in a sling.  Taking Tylenol for symptoms.  On examination the incision is intact.  Rotator cuff strength feels good.  Has about 80 degrees of abduction and forward flexion passively.  Plan at this time is to have him start a home exercise program.  3-week return for clinical recheck and likely initiation of strengthening at that time.  No coarse grinding or crepitus with internal/external rotation of the shoulder at 90 degrees of abduction.  Follow-Up Instructions: No follow-ups on file.   Orders:  No orders of the defined types were placed in this encounter.  No orders of the defined types were placed in this encounter.   Imaging: No results found.  PMFS History: Patient Active Problem List   Diagnosis Date Noted   Synovitis of right shoulder 08/09/2022   Nontraumatic complete tear of right rotator cuff 08/09/2022   Rotator cuff arthropathy of right shoulder 02/11/2021   Snoring 02/11/2021   Smoker 02/11/2021   Dislocation of tarsometatarsal joint of right foot 09/18/2016   Closed dislocation of tarsometatarsal joint of right foot 07/07/2016   Crush injury of right foot 05/18/2016   Past Medical History:  Diagnosis Date   Chronic idiopathic thrombocytopenia ([place])     Family History  Problem Relation Age of  Onset   Hypertension Father    Heart disease Father    Cancer Maternal Uncle    Leukemia Maternal Uncle    Leukemia Cousin    Leukemia Cousin    Leukemia Cousin     Past Surgical History:  Procedure Laterality Date   HAND SURGERY     ORIF TOE FRACTURE Right 05/20/2016   Procedure: OPEN REDUCTION INTERNAL FIXATION (ORIF) RIGHT 2ND  METATARSAL (TOE) FRACTURE, OPEN REDUCTION INTERNAL FIXATION LISFRANC JOINT;  Surgeon: Marybelle Killings, MD;  Location: [place];  Service: Orthopedics;  Laterality: Right;   SHOULDER ARTHROSCOPY WITH SUBACROMIAL DECOMPRESSION, ROTATOR CUFF REPAIR AND BICEP TENDON REPAIR Right 08/06/2022   Procedure: RIGHT SHOULDER ARTHROSCOPY, DEBRIDEMENT, MINI OPEN ROTATOR CUFF TEAR REPAIR, BICEPS TENODESIS;  Surgeon: Meredith Pel, MD;  Location: [place];  Service: Orthopedics;  Laterality: Right;   TONSILLECTOMY     Social History   Occupational History   Not on file  Tobacco Use   Smoking status: Some Days    Passive exposure: Never   Smokeless tobacco: Never  Vaping Use   Vaping Use: Some days  Substance and Sexual Activity   Alcohol use: Yes   Drug use: Not on file   Sexual activity: Not on file

## 2022-08-29 ENCOUNTER — Encounter: Payer: Self-pay | Admitting: Orthopedic Surgery

## 2022-09-16 ENCOUNTER — Encounter: Payer: Self-pay | Admitting: Orthopedic Surgery

## 2022-09-16 ENCOUNTER — Ambulatory Visit (INDEPENDENT_AMBULATORY_CARE_PROVIDER_SITE_OTHER): Payer: BC Managed Care – PPO | Admitting: Orthopedic Surgery

## 2022-09-16 DIAGNOSIS — M75101 Unspecified rotator cuff tear or rupture of right shoulder, not specified as traumatic: Secondary | ICD-10-CM

## 2022-09-16 NOTE — Addendum Note (Signed)
Addended byLaurann Montana on: 09/16/2022 09:44 AM   Modules accepted: Orders

## 2022-09-16 NOTE — Progress Notes (Signed)
Post-Op Visit Note   Patient: Christian Roberson           Date of Birth: 09/13/1980           MRN: [id number] Visit Date: 09/16/2022 PCP: Patient, No Pcp Per   Assessment & Plan:  Chief Complaint:  Chief Complaint  Patient presents with   Right Shoulder - Routine Post Op    08/06/22 Right shoulder arthroscopy with limited debridement of the superior labrum and mini open rotator cuff tear repair and subacromial decompression   Visit Diagnoses:  1. Tear of right supraspinatus tendon     Plan: Patient is a 42 year old male who presents s/p right shoulder arthroscopy with debridement of superior labrum and mini open rotator cuff tear repair on 08/06/2022.  He is just shy of 6 weeks out.  Overall he feels he is constantly improving and progressing though somewhat slowly.  Has not plateaued or hit a wall yet.  He states the incision is healing up well.  Still has some difficulty sleeping at night and is sleeping on a couch so he does not disturb his wife.  He has not been actively lifting the arm or lifting anything with the operative arm.  He is doing a home exercise program consisting primarily of some passive forward flexion exercises passive external rotation exercises, bicep flexion exercises and tricep extension exercises.  He is really not doing anything to work on passive or active abduction.  On exam, patient has 25 degrees external rotation, 60 degrees abduction, 110 degrees forward elevation.  Incisions are well-healed with no evidence of infection or dehiscence.  2+ radial pulse of the operative extremity.  Intact EPL, pronation/supination, bicep, tricep, deltoid.  Axillary nerve is intact with deltoid firing.  Excellent subscapularis strength.  He has 5 -/5 external rotation strength and 4+/5 supraspinatus strength.  Plan is to start physical therapy to focus primarily on active range of motion.  He may start some light rotator cuff strengthening exercises.  Emphasized that he needs to  work primarily on passive abduction based on exam today.  He will start doing this and then follow-up for clinical recheck with Dr. Marlou Sa in 4 weeks.  Cautioned against any heavy lifting or lifting out away from his body.  Follow-Up Instructions: No follow-ups on file.   Orders:  No orders of the defined types were placed in this encounter.  No orders of the defined types were placed in this encounter.   Imaging: No results found.  PMFS History: Patient Active Problem List   Diagnosis Date Noted   Synovitis of right shoulder 08/09/2022   Nontraumatic complete tear of right rotator cuff 08/09/2022   Rotator cuff arthropathy of right shoulder 02/11/2021   Snoring 02/11/2021   Smoker 02/11/2021   Dislocation of tarsometatarsal joint of right foot 09/18/2016   Closed dislocation of tarsometatarsal joint of right foot 07/07/2016   Crush injury of right foot 05/18/2016   Past Medical History:  Diagnosis Date   Chronic idiopathic thrombocytopenia ([place])     Family History  Problem Relation Age of Onset   Hypertension Father    Heart disease Father    Cancer Maternal Uncle    Leukemia Maternal Uncle    Leukemia Cousin    Leukemia Cousin    Leukemia Cousin     Past Surgical History:  Procedure Laterality Date   HAND SURGERY     ORIF TOE FRACTURE Right 05/20/2016   Procedure: OPEN REDUCTION INTERNAL FIXATION (ORIF) RIGHT 2ND  METATARSAL (TOE) FRACTURE, OPEN REDUCTION INTERNAL FIXATION LISFRANC JOINT;  Surgeon: Marybelle Killings, MD;  Location: [place];  Service: Orthopedics;  Laterality: Right;   SHOULDER ARTHROSCOPY WITH SUBACROMIAL DECOMPRESSION, ROTATOR CUFF REPAIR AND BICEP TENDON REPAIR Right 08/06/2022   Procedure: RIGHT SHOULDER ARTHROSCOPY, DEBRIDEMENT, MINI OPEN ROTATOR CUFF TEAR REPAIR, BICEPS TENODESIS;  Surgeon: Meredith Pel, MD;  Location: [place];  Service: Orthopedics;  Laterality: Right;   TONSILLECTOMY     Social History   Occupational History   Not on file   Tobacco Use   Smoking status: Some Days    Passive exposure: Never   Smokeless tobacco: Never  Vaping Use   Vaping Use: Some days  Substance and Sexual Activity   Alcohol use: Yes   Drug use: Not on file   Sexual activity: Not on file

## 2022-09-29 ENCOUNTER — Ambulatory Visit: Payer: BC Managed Care – PPO

## 2022-10-14 ENCOUNTER — Ambulatory Visit: Payer: BC Managed Care – PPO | Admitting: Orthopedic Surgery

## 2022-10-15 ENCOUNTER — Ambulatory Visit
Admission: EM | Admit: 2022-10-15 | Discharge: 2022-10-15 | Disposition: A | Discharge status: Home / Self Care | Payer: BC Managed Care – PPO

## 2022-10-15 DIAGNOSIS — R6889 Other general symptoms and signs: Secondary | ICD-10-CM | POA: Diagnosis not present

## 2022-10-15 NOTE — ED Triage Notes (Signed)
Patient to Urgent Care with complaints of nasal congestion/ drainage, productive cough w/ discolored mucus, headaches, and generalized body aches.  Emesis yesterday. Fever 101 yesterday. Treating with tylenol/ cough medication.   Reports symptoms started approx 3 days ago.

## 2022-10-15 NOTE — ED Provider Notes (Signed)
[facility]    CSN: [id number] Arrival date & time: 10/15/22  0840      History   Chief Complaint Chief Complaint  Patient presents with   Nasal Congestion    Coughing, Headache, Runny Nose, Aches - Entered by patient    HPI Christian Roberson is a 42 y.o. male.   HPI  Presents urgent care with complaints of nasal congestion and drainage, productive cough with discolored mucus, headaches, generalized bodyaches.  Episodes of vomiting yesterday.  Endorses fever Tmax 101 F yesterday.  Symptoms started 3 days ago.  No fever today but with recent Tylenol doses.  Past Medical History:  Diagnosis Date   Chronic idiopathic thrombocytopenia [facility])     Patient Active Problem List   Diagnosis Date Noted   Synovitis of right shoulder 08/09/2022   Nontraumatic complete tear of right rotator cuff 08/09/2022   Rotator cuff arthropathy of right shoulder 02/11/2021   Snoring 02/11/2021   Smoker 02/11/2021   Dislocation of tarsometatarsal joint of right foot 09/18/2016   Closed dislocation of tarsometatarsal joint of right foot 07/07/2016   Crush injury of right foot 05/18/2016    Past Surgical History:  Procedure Laterality Date   HAND SURGERY     ORIF TOE FRACTURE Right 05/20/2016   Procedure: OPEN REDUCTION INTERNAL FIXATION (ORIF) RIGHT 2ND  METATARSAL (TOE) FRACTURE, OPEN REDUCTION INTERNAL FIXATION LISFRANC JOINT;  Surgeon: Marybelle Killings, MD;  Location: [place];  Service: Orthopedics;  Laterality: Right;   SHOULDER ARTHROSCOPY WITH SUBACROMIAL DECOMPRESSION, ROTATOR CUFF REPAIR AND BICEP TENDON REPAIR Right 08/06/2022   Procedure: RIGHT SHOULDER ARTHROSCOPY, DEBRIDEMENT, MINI OPEN ROTATOR CUFF TEAR REPAIR, BICEPS TENODESIS;  Surgeon: Meredith Pel, MD;  Location: [place];  Service: Orthopedics;  Laterality: Right;   TONSILLECTOMY         Home Medications    Prior to Admission medications   Medication Sig Start Date End Date Taking? Authorizing Provider  gabapentin  (NEURONTIN) 300 MG capsule Take 1 capsule (300 mg total) by mouth 3 (three) times daily. 08/06/22 08/06/23  Magnant, Charles L, PA-C  methocarbamol (ROBAXIN) 500 MG tablet Take 1 tablet (500 mg total) by mouth every 8 (eight) hours as needed for muscle spasms. 08/06/22   Magnant, Charles L, PA-C  oxyCODONE (ROXICODONE) 5 MG immediate release tablet Take 1-2 tablets (5-10 mg total) by mouth every 4 (four) hours as needed for severe pain. 08/07/22   Magnant, Gerrianne Scale, PA-C    Family History Family History  Problem Relation Age of Onset   Hypertension Father    Heart disease Father    Cancer Maternal Uncle    Leukemia Maternal Uncle    Leukemia Cousin    Leukemia Cousin    Leukemia Cousin     Social History Social History   Tobacco Use   Smoking status: Some Days    Passive exposure: Never   Smokeless tobacco: Never  Vaping Use   Vaping Use: Some days  Substance Use Topics   Alcohol use: Yes     Allergies   Patient has no known allergies.   Review of Systems Review of Systems   Physical Exam Triage Vital Signs ED Triage Vitals [10/15/22 0945]  Enc Vitals Group     BP 119/83     Pulse Rate 94     Resp 18     Temp 98.6 F (37 C)     Temp Source Oral     SpO2 95 %  Weight      Height      Head Circumference      Peak Flow      Pain Score 4     Pain Loc      Pain Edu?      Excl. in [place]?    No data found.  Updated Vital Signs BP 119/83 (BP Location: Right Arm)   Pulse 94   Temp 98.6 F (37 C) (Oral)   Resp 18   SpO2 95%   Visual Acuity Right Eye Distance:   Left Eye Distance:   Bilateral Distance:    Right Eye Near:   Left Eye Near:    Bilateral Near:     Physical Exam Vitals reviewed.  Constitutional:      Appearance: Normal appearance. He is ill-appearing.  HENT:     Nose: Congestion and rhinorrhea present.     Mouth/Throat:     Pharynx: No oropharyngeal exudate or posterior oropharyngeal erythema.  Cardiovascular:     Rate and  Rhythm: Normal rate and regular rhythm.     Pulses: Normal pulses.     Heart sounds: Normal heart sounds.  Pulmonary:     Effort: Pulmonary effort is normal.     Breath sounds: Normal breath sounds.  Skin:    General: Skin is warm and dry.  Neurological:     General: No focal deficit present.     Mental Status: He is alert and oriented to person, place, and time.  Psychiatric:        Mood and Affect: Mood normal.        Behavior: Behavior normal.      UC Treatments / Results  Labs (all labs ordered are listed, but only abnormal results are displayed) Labs Reviewed - No data to display  EKG   Radiology No results found.  Procedures Procedures (including critical care time)  Medications Ordered in UC Medications - No data to display  Initial Impression / Assessment and Plan / UC Course  I have reviewed the triage vital signs and the nursing notes.  Pertinent labs & imaging results that were available during my care of the patient were reviewed by me and considered in my medical decision making (see chart for details).   Patient is afebrile here with recent antipyretics. Satting well on room air. Overall is ill appearing, well hydrated, without respiratory distress. Pulmonary exam is unremarkable.  Lungs CTAB without wheezing, rhonchi, rales.  GI erythema.  No peritonsillar exudates.  Patient's symptoms are consistent with an acute viral process including influenza and COVID.  He is outside the treatment window for influenza and declines COVID swab.  Recommending continued use of OTC medication for symptom control.  Final Clinical Impressions(s) / UC Diagnoses   Final diagnoses:  None   Discharge Instructions   None    ED Prescriptions   None    PDMP not reviewed this encounter.   Rose Phi, [place] 10/15/22 1004

## 2022-10-15 NOTE — Discharge Instructions (Addendum)
You have been diagnosed with a viral upper respiratory infection based on your symptoms and exam. Viral illnesses cannot be treated with antibiotics - they are self limiting - and you should find your symptoms resolving within a few days. Get plenty of rest and non-caffeinated fluids. Watch for signs of dehydration including reduced urine output and dark colored urine.  We recommend you use over-the-counter medications for symptom control including acetaminophen (Tylenol), ibuprofen (Advil/Motrin) or naproxen (Aleve) for throat pain, fever, chills or body aches. You may combine use of acetaminophen and ibuprofen/naproxen if needed.  Some patients find an pain-relieving throat spray such as Chloraseptic to be effective.  Also recommend cold/cough medication containing a cough suppressant such as dextromethorphan, as needed. Please note that some cough medications are not recommended if you suffer from hypertension.    Saline mist spray is helpful for removing excess mucus from your nose.  Room humidifiers are helpful to ease breathing at night. I recommend guaifenesin (Mucinex) with plenty of water throughout the day to help thin and loosen mucus secretions in your respiratory passages.   If appropriate based upon your other medical problems, you might also find relief of nasal/sinus congestion symptoms by using a nasal decongestant such as fluticasone (Flonase ) or pseudoephedrine (Sudafed sinus).  You will need to obtain Sudafed from behind the pharmacist counter.  Speak to the pharmacist to verify that you are not duplicating medications with other over-the-counter formulations that you may be using.

## 2022-10-16 ENCOUNTER — Ambulatory Visit: Payer: BC Managed Care – PPO

## 2022-10-28 ENCOUNTER — Ambulatory Visit: Payer: BC Managed Care – PPO | Admitting: Orthopedic Surgery

## 2022-10-28 DIAGNOSIS — Z9889 Other specified postprocedural states: Secondary | ICD-10-CM

## 2022-10-30 NOTE — Progress Notes (Unsigned)
Post-Op Visit Note   Patient: Christian Roberson           Date of Birth: Aug 27, 1980           MRN: [id number] Visit Date: 10/28/2022 PCP: Patient, No Pcp Per   Assessment & Plan:  Chief Complaint:  Chief Complaint  Patient presents with   Right Shoulder - Follow-up    08/06/22 Right shoulder arthroscopy with limited debridement of the superior labrum and mini open rotator cuff tear repair and subacromial decompression   Visit Diagnoses:  1. S/P rotator cuff repair     Plan: Patient underwent right shoulder arthroscopy with rotator cuff tear repair and a 2 x 3 anchor configuration.  Done 08/06/2022.  Patient is having trouble with decreased active forward flexion and abduction.  Has been doing therapy with a friend of his in the weight room.  Does not recall any injury.  Having a little bit of neck pain as well which radiates into the right arm.  On examination he does have limited abduction to about 50 or 60 degrees and limited forward flexion to about 70 degrees.  No coarse grinding or crepitus is present in the shoulder.  No asymmetric atrophy in the infraspinatus or supraspinatus fossa right versus left.  He has otherwise 5 out of 5 grip EPL FPL interosseous wrist flexion extension bicep triceps and deltoid strength.  Impression is recurrent rotator cuff tear which is retracted to the point where it is not palpable on exam.  Alternatively this could be referred pain and weakness from herniated disc in the neck.  He is not really having much in the way of paresthesias.  Rotator cuff repair most likely but in most of those patients I have felt some degree of crepitus with passive range of motion.  Need MRI arthrogram of the right shoulder to evaluate for recurrent rotator cuff tear.  Follow-up after that study.  He does have very good external rotation strength bilaterally but it is just the abduction that is lacking at this time.  Follow-Up Instructions: No follow-ups on file.   Orders:   Orders Placed This Encounter  Procedures   MR SHOULDER RIGHT W CONTRAST   Arthrogram   No orders of the defined types were placed in this encounter.   Imaging: No results found.  PMFS History: Patient Active Problem List   Diagnosis Date Noted   Synovitis of right shoulder 08/09/2022   Nontraumatic complete tear of right rotator cuff 08/09/2022   Rotator cuff arthropathy of right shoulder 02/11/2021   Snoring 02/11/2021   Smoker 02/11/2021   Dislocation of tarsometatarsal joint of right foot 09/18/2016   Closed dislocation of tarsometatarsal joint of right foot 07/07/2016   Crush injury of right foot 05/18/2016   Past Medical History:  Diagnosis Date   Chronic idiopathic thrombocytopenia ([place])     Family History  Problem Relation Age of Onset   Hypertension Father    Heart disease Father    Cancer Maternal Uncle    Leukemia Maternal Uncle    Leukemia Cousin    Leukemia Cousin    Leukemia Cousin     Past Surgical History:  Procedure Laterality Date   HAND SURGERY     ORIF TOE FRACTURE Right 05/20/2016   Procedure: OPEN REDUCTION INTERNAL FIXATION (ORIF) RIGHT 2ND  METATARSAL (TOE) FRACTURE, OPEN REDUCTION INTERNAL FIXATION LISFRANC JOINT;  Surgeon: Marybelle Killings, MD;  Location: [place];  Service: Orthopedics;  Laterality: Right;   SHOULDER ARTHROSCOPY  WITH SUBACROMIAL DECOMPRESSION, ROTATOR CUFF REPAIR AND BICEP TENDON REPAIR Right 08/06/2022   Procedure: RIGHT SHOULDER ARTHROSCOPY, DEBRIDEMENT, MINI OPEN ROTATOR CUFF TEAR REPAIR, BICEPS TENODESIS;  Surgeon: Meredith Pel, MD;  Location: [place];  Service: Orthopedics;  Laterality: Right;   TONSILLECTOMY     Social History   Occupational History   Not on file  Tobacco Use   Smoking status: Some Days    Passive exposure: Never   Smokeless tobacco: Never  Vaping Use   Vaping Use: Some days  Substance and Sexual Activity   Alcohol use: Yes   Drug use: Not on file   Sexual activity: Not on file

## 2022-10-31 ENCOUNTER — Encounter: Payer: Self-pay | Admitting: Orthopedic Surgery

## 2022-11-16 ENCOUNTER — Other Ambulatory Visit: Payer: BC Managed Care – PPO

## 2022-11-25 ENCOUNTER — Inpatient Hospital Stay: Admission: RE | Admit: 2022-11-25 | Payer: BC Managed Care – PPO | Source: Ambulatory Visit

## 2022-11-25 ENCOUNTER — Ambulatory Visit: Payer: BC Managed Care – PPO | Admitting: Orthopedic Surgery

## 2022-11-26 ENCOUNTER — Ambulatory Visit: Payer: BC Managed Care – PPO | Admitting: Orthopedic Surgery

## 2022-12-04 ENCOUNTER — Ambulatory Visit: Payer: BC Managed Care – PPO | Admitting: Orthopedic Surgery

## 2022-12-28 ENCOUNTER — Inpatient Hospital Stay: Admission: RE | Admit: 2022-12-28 | Payer: BC Managed Care – PPO | Source: Ambulatory Visit

## 2022-12-28 ENCOUNTER — Other Ambulatory Visit: Payer: BC Managed Care – PPO
